# Patient Record
Sex: Male | Born: 1965 | Race: Black or African American | Hispanic: No | Marital: Married | Smoking: Current every day smoker
Health system: Southern US, Community
[De-identification: ages and names within clinical notes are randomized; demographics above are authoritative.]

## PROBLEM LIST (undated history)

## (undated) HISTORY — PX: HERNIA REPAIR: SHX51

---

## 2022-02-01 ENCOUNTER — Other Ambulatory Visit: Payer: Self-pay

## 2022-02-01 ENCOUNTER — Emergency Department: Payer: Non-veteran care

## 2022-02-01 ENCOUNTER — Emergency Department
Admission: EM | Admit: 2022-02-01 | Discharge: 2022-02-01 | Disposition: A | Discharge status: Other Acute Inpatient Hospital | Payer: Non-veteran care | Attending: Emergency Medicine | Admitting: Emergency Medicine

## 2022-02-01 DIAGNOSIS — A419 Sepsis, unspecified organism: Secondary | ICD-10-CM | POA: Diagnosis not present

## 2022-02-01 DIAGNOSIS — Z20822 Contact with and (suspected) exposure to covid-19: Secondary | ICD-10-CM | POA: Insufficient documentation

## 2022-02-01 DIAGNOSIS — R0602 Shortness of breath: Secondary | ICD-10-CM | POA: Diagnosis present

## 2022-02-01 LAB — COMPREHENSIVE METABOLIC PANEL
ALT: 53 U/L — ABNORMAL HIGH (ref 0–44)
AST: 44 U/L — ABNORMAL HIGH (ref 15–41)
Albumin: 2.8 g/dL — ABNORMAL LOW (ref 3.5–5.0)
Alkaline Phosphatase: 108 U/L (ref 38–126)
Anion gap: 16 — ABNORMAL HIGH (ref 5–15)
BUN: 22 mg/dL — ABNORMAL HIGH (ref 6–20)
CO2: 25 mmol/L (ref 22–32)
Calcium: 8.5 mg/dL — ABNORMAL LOW (ref 8.9–10.3)
Chloride: 92 mmol/L — ABNORMAL LOW (ref 98–111)
Creatinine, Ser: 1.21 mg/dL (ref 0.61–1.24)
GFR, Estimated: >60 mL/min (ref >60)
Glucose, Bld: 282 mg/dL — ABNORMAL HIGH (ref 70–99)
Potassium: 4.5 mmol/L (ref 3.5–5.1)
Sodium: 133 mmol/L — ABNORMAL LOW (ref 135–145)
Total Bilirubin: 2.2 mg/dL — ABNORMAL HIGH (ref 0.3–1.2)
Total Protein: 7.2 g/dL (ref 6.5–8.1)

## 2022-02-01 LAB — CBC WITH DIFFERENTIAL/PLATELET
Abs Immature Granulocytes: 0.1 10*3/uL — ABNORMAL HIGH (ref 0.00–0.07)
Basophils Absolute: 0.1 10*3/uL (ref 0.0–0.1)
Basophils Relative: 1 %
Eosinophils Absolute: 0 10*3/uL (ref 0.0–0.5)
Eosinophils Relative: 0 %
HCT: 52.9 % — ABNORMAL HIGH (ref 39.0–52.0)
Hemoglobin: 17.6 g/dL — ABNORMAL HIGH (ref 13.0–17.0)
Immature Granulocytes: 1 %
Lymphocytes Relative: 6 %
Lymphs Abs: 1 10*3/uL (ref 0.7–4.0)
MCH: 30.5 pg (ref 26.0–34.0)
MCHC: 33.3 g/dL (ref 30.0–36.0)
MCV: 91.7 fL (ref 80.0–100.0)
Monocytes Absolute: 2.6 10*3/uL — ABNORMAL HIGH (ref 0.1–1.0)
Monocytes Relative: 16 %
Neutro Abs: 12.7 10*3/uL — ABNORMAL HIGH (ref 1.7–7.7)
Neutrophils Relative %: 76 %
Platelets: 382 10*3/uL (ref 150–400)
RBC: 5.77 MIL/uL (ref 4.22–5.81)
RDW: 13.2 % (ref 11.5–15.5)
Smear Review: NORMAL
WBC: 16.4 10*3/uL — ABNORMAL HIGH (ref 4.0–10.5)
nRBC: 0 % (ref 0.0–0.2)

## 2022-02-01 LAB — LACTIC ACID, PLASMA: Lactic Acid, Venous: 3.4 mmol/L (ref 0.5–1.9)

## 2022-02-01 LAB — BLOOD GAS, VENOUS
Acid-Base Excess: 1.5 mmol/L (ref 0.0–2.0)
Bicarbonate: 27.2 mmol/L (ref 20.0–28.0)
O2 Saturation: 75.9 %
Patient temperature: 37
pCO2, Ven: 46 mmHg (ref 44–60)
pH, Ven: 7.38 (ref 7.25–7.43)
pO2, Ven: 42 mmHg (ref 32–45)

## 2022-02-01 LAB — RESP PANEL BY RT-PCR (FLU A&B, COVID) ARPGX2
Influenza A by PCR: NEGATIVE
Influenza B by PCR: NEGATIVE
SARS Coronavirus 2 by RT PCR: NEGATIVE

## 2022-02-01 LAB — PROTIME-INR
INR: 1.2 (ref 0.8–1.2)
Prothrombin Time: 15.4 seconds — ABNORMAL HIGH (ref 11.4–15.2)

## 2022-02-01 LAB — TROPONIN I (HIGH SENSITIVITY)
Troponin I (High Sensitivity): 72 ng/L — ABNORMAL HIGH (ref <18)
Troponin I (High Sensitivity): 53 ng/L — ABNORMAL HIGH (ref <18)

## 2022-02-01 LAB — APTT: aPTT: 31 seconds (ref 24–36)

## 2022-02-01 LAB — BRAIN NATRIURETIC PEPTIDE: B Natriuretic Peptide: 888.4 pg/mL — ABNORMAL HIGH (ref 0.0–100.0)

## 2022-02-01 MED ORDER — IOHEXOL 300 MG/ML  SOLN
75.0000 mL | Freq: Once | INTRAMUSCULAR | Status: AC | PRN
  Administered 2022-02-01: 75 mL via INTRAVENOUS

## 2022-02-01 MED ORDER — DILTIAZEM HCL-DEXTROSE 125-5 MG/125ML-% IV SOLN (PREMIX)
5.0000 mg/h | INTRAVENOUS | Status: DC
  Administered 2022-02-01: 5 mg/h via INTRAVENOUS
  Filled 2022-02-01: qty 125

## 2022-02-01 MED ORDER — LACTATED RINGERS IV BOLUS (SEPSIS)
1000.0000 mL | Freq: Once | INTRAVENOUS | Status: AC
  Administered 2022-02-01: 1000 mL via INTRAVENOUS

## 2022-02-01 MED ORDER — LACTATED RINGERS IV BOLUS
1000.0000 mL | Freq: Once | INTRAVENOUS | Status: AC
  Administered 2022-02-01: 1000 mL via INTRAVENOUS

## 2022-02-01 MED ORDER — FENTANYL CITRATE PF 50 MCG/ML IJ SOSY
50.0000 ug | PREFILLED_SYRINGE | INTRAMUSCULAR | Status: DC | PRN
  Administered 2022-02-01: 50 ug via INTRAVENOUS
  Filled 2022-02-01: qty 1

## 2022-02-01 MED ORDER — SODIUM CHLORIDE 0.9 % IV SOLN
500.0000 mg | Freq: Once | INTRAVENOUS | Status: AC
  Administered 2022-02-01: 500 mg via INTRAVENOUS
  Filled 2022-02-01: qty 5

## 2022-02-01 MED ORDER — SODIUM CHLORIDE 0.9 % IV SOLN: Freq: Once | INTRAVENOUS | Status: AC

## 2022-02-01 MED ORDER — SODIUM CHLORIDE 0.9 % IV SOLN
2.0000 g | Freq: Once | INTRAVENOUS | Status: AC
  Administered 2022-02-01: 2 g via INTRAVENOUS
  Filled 2022-02-01: qty 20

## 2022-02-01 MED ORDER — ESMOLOL HCL-SODIUM CHLORIDE 2000 MG/100ML IV SOLN
25.0000 ug/kg/min | INTRAVENOUS | Status: DC
  Filled 2022-02-01: qty 100

## 2022-02-01 MED ORDER — VANCOMYCIN HCL IN DEXTROSE 1-5 GM/200ML-% IV SOLN
1000.0000 mg | Freq: Once | INTRAVENOUS | Status: AC
  Administered 2022-02-01: 1000 mg via INTRAVENOUS
  Filled 2022-02-01: qty 200

## 2022-02-01 NOTE — Consult Note (Signed)
PHARMACY -  BRIEF ANTIBIOTIC NOTE  ? ?Pharmacy has received consult(s) for vancomycin from an ED provider.  The patient's profile has been reviewed for ht/wt/allergies/indication/available labs.   ? ?One time order(s) placed for vancomycin 1 g  ? ?Further antibiotics/pharmacy consults should be ordered by admitting physician if indicated.       ?                ?Thank you, ?Derrek Gu, PharmD ?02/01/2022  7:36 AM ? ?

## 2022-02-01 NOTE — ED Notes (Signed)
Called [facility] for transfer spoke to [place] and images powershared [phone] ?

## 2022-02-01 NOTE — ED Notes (Signed)
EMTALA reviewed by this Charge RN and all required documentation is up to date. Pt is ready for transport at this time. ?

## 2022-02-01 NOTE — ED Provider Notes (Addendum)
Patient received in signout from Dr. Jacqualine Code.  Patient presenting with symptoms consistent with sepsis concerning for pneumonia with respiratory distress.  Currently protecting his airway.  Tachycardic to the 140s seems consistent with 2-1 and a flutter.  Chest x-ray on my review and interpretation does not show any evidence of pneumothorax but has consolidation of the left lung fields with suspected large effusion.  Will start empiric broad-spectrum antibiotics as he also has a leukocytosis.  Awaiting remainder of blood work and septic work-up. ? ?Patient's lactate is elevated work-up consistent with sepsis.  CT imaging ordered for complicated pneumonia shows evidence of large empyema with consolidation and likely bronchial obstruction.  He is persistently tachycardic but normotensive.  He is received his 30 cc/kg bolus and is still tachycardic and what appears to be persistent a flutter will place on esmolol drip for close titration.  Discussed the case with intensivist at this facility none they suspect patient is going to need cardiothoracic surgery as we do not have at this facility.  Patient receives his care at the [place] in [place] is requesting to be transferred to [facility].  Will consult [facility] for transfer. ? ?----------------------------------------- ?9:42 AM on 02/01/2022 ?----------------------------------------- ?Discussed case in consultation with Dr. Elenor Quinones of CT surgery at [facility] who is currently accepted patient to their service for operative management will keep n.p.o.  Patient remains tachycardic but otherwise well perfused and hemodynamically stable for transfer. ?  ?Merlyn Lot, MD ?02/01/22 [phone] ? ?----------------------------------------- ?10:06 AM on 02/01/2022 ?----------------------------------------- ? ?[facility] reached back out requesting patient be transition to diltiazem if possible as esmolol requires ICU placement in their limited capacity.  Will trial diltiazem.  He does appear  appropriately fluid resuscitated.  Troponin downtrending. ? ?.Critical Care ?Performed by: Merlyn Lot, MD ?Authorized by: Merlyn Lot, MD  ? ?Critical care provider statement:  ?  Critical care time (minutes):  34 ?  Critical care was necessary to treat or prevent imminent or life-threatening deterioration of the following conditions:  Sepsis ?  Critical care was time spent personally by me on the following activities:  Ordering and performing treatments and interventions, ordering and review of laboratory studies, ordering and review of radiographic studies, pulse oximetry, re-evaluation of patient's condition, review of old charts, obtaining history from patient or surrogate, examination of patient, evaluation of patient's response to treatment, discussions with primary provider, discussions with consultants and development of treatment plan with patient or surrogate ? ?  ?Merlyn Lot, MD ?02/01/22 1007 ? ?

## 2022-02-01 NOTE — ED Provider Notes (Signed)
? ?[facility] ?Provider Note ? ? ? Event Date/Time  ? First MD Initiated Contact with Patient 02/01/22 [phone]   ?  (approximate) ? ? ?History  ? ?Shortness of Breath ? ? ?HPI ? ?Shawn FarrierKenneth L Steele is a 56 y.o. male   reports a history of ammonia,.  Patient reports no significant past medical history except he does take a blood pressure medicine carvedilol.  Review of note from January 30, 2022 patient seen by provider Dr. Tanna Furryolley at [facility].  Patient was diagnosed with pneumonia.  Patient reports she was given prescription for Tessalon Perle, but he got no antibiotic from the pharmacy when he went to pick it up ? ?Reports good health until about Saturday when he started develop a cough and congestion.  It is worsened.  Now associated with shortness of breath ? ?EMS reports patient appeared diaphoretic dyspneic, started on Solu-Medrol and had wheezing given 1 DuoNeb. ? ?EMS also reports patient appeared to be in a-flutter ? ? ? ?  ? ? ?Physical Exam  ? ?Triage Vital Signs: ?ED Triage Vitals  ?Enc Vitals Group  ?   BP 02/01/22 0653 133/85  ?   Pulse Rate 02/01/22 0653 (!) 146  ?   Resp 02/01/22 0653 (!) 24  ?   Temp 02/01/22 0653 98.4 ?F (36.9 ?C)  ?   Temp Source 02/01/22 0653 Oral  ?   SpO2 02/01/22 0653 91 %  ?   Weight 02/01/22 0654 135 lb (61.2 kg)  ?   Height 02/01/22 0654 5\' 4"  (1.626 m)  ?   Head Circumference --   ?   Peak Flow --   ?   Pain Score 02/01/22 0654 0  ?   Pain Loc --   ?   Pain Edu? --   ?   Excl. in GC? --   ? ? ?Most recent vital signs: ?Vitals:  ? 02/01/22 0715 02/01/22 0730  ?BP:  122/82  ?Pulse: (!) 147 (!) 148  ?Resp: (!) 21 18  ?Temp:    ?SpO2: 96% 97%  ? ? ? ?General: Awake, but appears in distress.  Increased work of breathing with mild tachypnea but notably tachycardic.  Slightly diaphoretic.  Work of breathing mildly elevated but in no extremis. ?CV:  Good peripheral perfusion.  Tachycardic, no noted murmurs ?Resp:  Mild tachypnea, increased work of breathing.   Accessory muscle use.  Lung sounds very diminished over the left chest, normal respirations and clear lung tones over the right.  Notable rales rhonchi and diminished lung sounds over the left chest. ?Abd:  No distention.  Denies abdominal pain to palpation of all quadrants. ?Lower extremities negative for edema.  No JVD. ?No rash.  Normal capillary refill ?Other:   ? ? ?ED Results / Procedures / Treatments  ? ?Labs ?(all labs ordered are listed, but only abnormal results are displayed) ?Labs Reviewed  ?COMPREHENSIVE METABOLIC PANEL - Abnormal; Notable for the following components:  ?    Result Value  ? Sodium 133 (*)   ? Chloride 92 (*)   ? Glucose, Bld 282 (*)   ? BUN 22 (*)   ? Calcium 8.5 (*)   ? Albumin 2.8 (*)   ? AST 44 (*)   ? ALT 53 (*)   ? Total Bilirubin 2.2 (*)   ? Anion gap 16 (*)   ? All other components within normal limits  ?CBC WITH DIFFERENTIAL/PLATELET - Abnormal; Notable for the following components:  ?  WBC 16.4 (*)   ? Hemoglobin 17.6 (*)   ? HCT 52.9 (*)   ? Neutro Abs 12.7 (*)   ? Monocytes Absolute 2.6 (*)   ? Abs Immature Granulocytes 0.10 (*)   ? All other components within normal limits  ?PROTIME-INR - Abnormal; Notable for the following components:  ? Prothrombin Time 15.4 (*)   ? All other components within normal limits  ?TROPONIN I (HIGH SENSITIVITY) - Abnormal; Notable for the following components:  ? Troponin I (High Sensitivity) 72 (*)   ? All other components within normal limits  ?CULTURE, BLOOD (ROUTINE X 2)  ?CULTURE, BLOOD (ROUTINE X 2)  ?URINE CULTURE  ?MRSA NEXT GEN BY PCR, NASAL  ?APTT  ?BLOOD GAS, VENOUS  ?LACTIC ACID, PLASMA  ?LACTIC ACID, PLASMA  ?URINALYSIS, COMPLETE (UACMP) WITH MICROSCOPIC  ?BRAIN NATRIURETIC PEPTIDE  ? ? ? ?EKG ? ?EKG is reviewed and interpreted by me, appears to be atrial flutter with 2 1 conduction, heart rate 150.  P waves noted to be in flutter.  QRS normal.  Nonspecific T wave abnormality, suspect rate related.  No obvious acute ischemic changes  noted ? ? ?RADIOLOGY ? ?Chest x-ray pending at time of signout, Dr. Roxan Hockey to review ? ? ?PROCEDURES: ? ?Critical Care performed: Yes, see critical care procedure note(s) ? ?Patient presents with extreme tachycardia in the setting of recent pneumonia, but reports she was not able to pick up antibiotic portion of his prescription.  His heart rate is significantly elevated, and patient is felt to be at elevated risk for acute cardiovascular collapse requiring prompt an emergent evaluation by myself. ? ?CRITICAL CARE ?Performed by: Sharyn Creamer ? ? ?Total critical care time: 25 minutes ? ?Critical care time was exclusive of separately billable procedures and treating other patients. ? ?Critical care was necessary to treat or prevent imminent or life-threatening deterioration. ? ?Critical care was time spent personally by me on the following activities: development of treatment plan with patient and/or surrogate as well as nursing, discussions with consultants, evaluation of patient's response to treatment, examination of patient, obtaining history from patient or surrogate, ordering and performing treatments and interventions, ordering and review of laboratory studies, ordering and review of radiographic studies, pulse oximetry and re-evaluation of patient's condition. ? ? ?Procedures ? ? ?MEDICATIONS ORDERED IN ED: ?Medications  ?lactated ringers bolus 1,000 mL (1,000 mLs Intravenous New Bag/Given 02/01/22 0734)  ?vancomycin (VANCOCIN) IVPB 1000 mg/200 mL premix (has no administration in time range)  ?azithromycin (ZITHROMAX) 500 mg in sodium chloride 0.9 % 250 mL IVPB (has no administration in time range)  ?lactated ringers bolus 1,000 mL (1,000 mLs Intravenous New Bag/Given 02/01/22 0717)  ?cefTRIAXone (ROCEPHIN) 2 g in sodium chloride 0.9 % 100 mL IVPB (0 g Intravenous Stopped 02/01/22 0807)  ? ? ? ?IMPRESSION / MDM / ASSESSMENT AND PLAN / ED COURSE  ?I reviewed the triage vital signs and the nursing notes. ?              ?               ? ?Differential diagnosis includes, but is not limited to, acute pneumonia or sepsis, viral etiologies, influenza, and based upon his chest exam also notable concern for possible pleural effusion, empyema, and less likely ACS or acute pulmonary embolism.  He seems to be presenting with signs and symptoms highly suggestive of infectious etiology with acute pulmonary symptomatology as well as atrial flutter with 2 1 conduction and tachycardia.  Will  initiate fluid bolus, await laboratory testing and x-ray.. ? ?The patient is on the cardiac monitor to evalate for evidence of arrhythmia and/or significant heart rate changes. ? ?Clinical Course as of 02/01/22 0811  ?Fri Feb 01, 2022  ?0709 Ongoing care assigned to Dr. Roxan Hockey.  Patient has a significant battery of remaining testing including sepsis panel, chest x-ray, reassessments etc.  At this point given the patient's apparent atrial flutter with 2-1 block we will trial fluid bolus to see if this results in improvement in rate, but may need consideration for additional or use of rate control medication such as diltiazem if not responding. [MQ]  ?0630 Discussed with Dr. Roxan Hockey that the patient [MQ]  ?1601 Discussed with Dr. Roxan Hockey I certainly anticipate this patient will require admission to the hospital, but much of his work-up is pending at this time. [MQ]  ?  ?Clinical Course User Index ?[MQ] Sharyn Creamer, MD  ? ? ? ?FINAL CLINICAL IMPRESSION(S) / ED DIAGNOSES  ? ?Final diagnoses:  ?Sepsis, due to unspecified organism, unspecified whether acute organ dysfunction present [facility])  ? ?My above listed impression is very provisional, Dr. Roxan Hockey is following up on the entirety of the patient's laboratory and imaging testing as well as reevaluations and responses to treatment. ? ?Rx / DC Orders  ? ?ED Discharge Orders   ? ? None  ? ?  ? ? ? ?Note:  This document was prepared using Dragon voice recognition software and may include unintentional  dictation errors. ?  Sharyn Creamer, MD ?02/01/22 [phone] ? ?

## 2022-02-01 NOTE — ED Notes (Signed)
Report given to [facility], ED RN at [facility]  ?

## 2022-02-01 NOTE — ED Notes (Signed)
Report given and care endorsed to oncoming shift 

## 2022-02-01 NOTE — ED Notes (Signed)
Report given to Sharen Heck with [employer].  ?

## 2022-02-01 NOTE — ED Notes (Signed)
Received call from [facility] patient accepted ED to ED  report number [id number]   ?

## 2022-02-01 NOTE — ED Triage Notes (Signed)
Pt to room 12 [facility] with c/o shortness of breath. Dx with PNA 2 days ago and did not begin taking antibiotics as prescribed.  ?

## 2022-02-01 NOTE — ED Notes (Signed)
Pt to ED c/o SOB and CP. Pt seen 2 days ago and diagnosed with pneumonia. Pt has not started antibiotics. Pt states SOB has worsened. Pt HR between 140-180s. Pt on 2L [place] d/t RA sat 88%. Pt diaphoretic. Pt A&Ox4.  ?

## 2022-02-01 NOTE — ED Notes (Signed)
Pt requesting and given blanket; no other needs voiced at this time. ?

## 2022-02-01 NOTE — ED Notes (Signed)
Pt requesting monitoring alarms be turned off. Pt monitor placed on comfort care in room. MD aware.  ?

## 2022-02-06 LAB — CULTURE, BLOOD (ROUTINE X 2)
Culture: NO GROWTH
Culture: NO GROWTH
Special Requests: ADEQUATE

## 2022-03-29 ENCOUNTER — Other Ambulatory Visit: Payer: Self-pay

## 2022-03-29 ENCOUNTER — Encounter (HOSPITAL_COMMUNITY): Payer: Self-pay

## 2022-03-29 ENCOUNTER — Emergency Department (HOSPITAL_COMMUNITY): Payer: No Typology Code available for payment source

## 2022-03-29 ENCOUNTER — Emergency Department (HOSPITAL_COMMUNITY)
Admission: EM | Admit: 2022-03-29 | Discharge: 2022-03-29 | Disposition: A | Discharge status: Home / Self Care | Payer: No Typology Code available for payment source | Attending: Emergency Medicine | Admitting: Emergency Medicine

## 2022-03-29 DIAGNOSIS — K2920 Alcoholic gastritis without bleeding: Secondary | ICD-10-CM

## 2022-03-29 DIAGNOSIS — R1013 Epigastric pain: Secondary | ICD-10-CM | POA: Diagnosis present

## 2022-03-29 DIAGNOSIS — R001 Bradycardia, unspecified: Secondary | ICD-10-CM | POA: Diagnosis not present

## 2022-03-29 LAB — COMPREHENSIVE METABOLIC PANEL
ALT: 17 U/L (ref 0–44)
AST: 23 U/L (ref 15–41)
Albumin: 3.8 g/dL (ref 3.5–5.0)
Alkaline Phosphatase: 94 U/L (ref 38–126)
Anion gap: 14 (ref 5–15)
BUN: 8 mg/dL (ref 6–20)
CO2: 25 mmol/L (ref 22–32)
Calcium: 9.4 mg/dL (ref 8.9–10.3)
Chloride: 101 mmol/L (ref 98–111)
Creatinine, Ser: 0.82 mg/dL (ref 0.61–1.24)
GFR, Estimated: >60 mL/min (ref >60)
Glucose, Bld: 135 mg/dL — ABNORMAL HIGH (ref 70–99)
Potassium: 4 mmol/L (ref 3.5–5.1)
Sodium: 140 mmol/L (ref 135–145)
Total Bilirubin: 1.3 mg/dL — ABNORMAL HIGH (ref 0.3–1.2)
Total Protein: 8.3 g/dL — ABNORMAL HIGH (ref 6.5–8.1)

## 2022-03-29 LAB — CBC WITH DIFFERENTIAL/PLATELET
Abs Immature Granulocytes: 0.03 10*3/uL (ref 0.00–0.07)
Basophils Absolute: 0.1 10*3/uL (ref 0.0–0.1)
Basophils Relative: 1 %
Eosinophils Absolute: 0 10*3/uL (ref 0.0–0.5)
Eosinophils Relative: 0 %
HCT: 52.1 % — ABNORMAL HIGH (ref 39.0–52.0)
Hemoglobin: 17.3 g/dL — ABNORMAL HIGH (ref 13.0–17.0)
Immature Granulocytes: 0 %
Lymphocytes Relative: 17 %
Lymphs Abs: 1.7 10*3/uL (ref 0.7–4.0)
MCH: 30.5 pg (ref 26.0–34.0)
MCHC: 33.2 g/dL (ref 30.0–36.0)
MCV: 91.7 fL (ref 80.0–100.0)
Monocytes Absolute: 0.2 10*3/uL (ref 0.1–1.0)
Monocytes Relative: 2 %
Neutro Abs: 8 10*3/uL — ABNORMAL HIGH (ref 1.7–7.7)
Neutrophils Relative %: 80 %
Platelets: 542 10*3/uL — ABNORMAL HIGH (ref 150–400)
RBC: 5.68 MIL/uL (ref 4.22–5.81)
RDW: 16.8 % — ABNORMAL HIGH (ref 11.5–15.5)
WBC: 10 10*3/uL (ref 4.0–10.5)
nRBC: 0 % (ref 0.0–0.2)

## 2022-03-29 LAB — URINALYSIS, ROUTINE W REFLEX MICROSCOPIC
Bacteria, UA: NONE SEEN
Bilirubin Urine: NEGATIVE
Glucose, UA: 50 mg/dL — AB
Ketones, ur: 80 mg/dL — AB
Leukocytes,Ua: NEGATIVE
Nitrite: NEGATIVE
Protein, ur: NEGATIVE mg/dL
Specific Gravity, Urine: 1.01 (ref 1.005–1.030)
pH: 6 (ref 5.0–8.0)

## 2022-03-29 LAB — LIPASE, BLOOD: Lipase: 26 U/L (ref 11–51)

## 2022-03-29 MED ORDER — MORPHINE SULFATE (PF) 4 MG/ML IV SOLN
4.0000 mg | Freq: Once | INTRAVENOUS | Status: AC
  Administered 2022-03-29: 4 mg via INTRAVENOUS
  Filled 2022-03-29: qty 1

## 2022-03-29 MED ORDER — ONDANSETRON HCL 4 MG/2ML IJ SOLN
4.0000 mg | Freq: Once | INTRAMUSCULAR | Status: AC
  Administered 2022-03-29: 4 mg via INTRAVENOUS
  Filled 2022-03-29: qty 2

## 2022-03-29 MED ORDER — IOHEXOL 300 MG/ML  SOLN
100.0000 mL | Freq: Once | INTRAMUSCULAR | Status: AC | PRN
  Administered 2022-03-29: 100 mL via INTRAVENOUS

## 2022-03-29 MED ORDER — PANTOPRAZOLE SODIUM 40 MG IV SOLR
40.0000 mg | Freq: Once | INTRAVENOUS | Status: AC
  Administered 2022-03-29: 40 mg via INTRAVENOUS
  Filled 2022-03-29: qty 10

## 2022-03-29 MED ORDER — ONDANSETRON HCL 4 MG PO TABS: 4.0000 mg | ORAL_TABLET | Freq: Four times a day (QID) | ORAL | 0 refills | Status: AC

## 2022-03-29 MED ORDER — LACTATED RINGERS IV BOLUS
1000.0000 mL | Freq: Once | INTRAVENOUS | Status: AC
  Administered 2022-03-29: 1000 mL via INTRAVENOUS

## 2022-03-29 NOTE — ED Notes (Signed)
Pt states understanding of dc instructions, importance of follow up. Pt denies questions or concerns and wheelchair transportation assistance provided upon dc. No belongings left in room upon dc. ? ?

## 2022-03-29 NOTE — ED Triage Notes (Signed)
[facility] reports pt coming from home c/o abdominal pain with nausea and vomiting this morning. ?

## 2022-03-29 NOTE — ED Provider Notes (Signed)
?[facility]-EMERGENCY DEPT ?Provider Note ? ? ?CSN: [id number] ?Arrival date & time: 03/29/22  1452 ? ?  ? ?History ? ?Chief Complaint  ?Patient presents with  ? Abdominal Pain  ? ? ?Lenoria FarrierKenneth L Chihuahua is a 56 y.o. male. ? ?Pt 55y/o male with hx of recent PNA with admission [facility] with left thoracoscopic total pulmonary decortication with intrapleural pneumonolysis, bronchoscopy for persistent empyema who is presenting today with complaints of abdominal pain.  Patient reports he has had abdominal pain since waking up this morning.  It is mostly in his epigastric area and around his umbilicus.  The pain is burning and constant.  It is worse when he tries to eat or drink anything and he has had at least 3 episodes of emesis.  He has also had some mild diarrhea.  He denies any blood in his emesis or stool.  He denies feeling short of breath he has felt clammy today but is unsure if he has had a fever.  He has had no prior abdominal surgeries.  Patient does admit to drinking too much last night.  He drank liquor, beer and wine.  His symptoms started when he woke up this morning. ? ?The history is provided by the patient.  ?Abdominal Pain ? ?  ? ?Home Medications ?Prior to Admission medications   ?Medication Sig Start Date End Date Taking? Authorizing Provider  ?ondansetron (ZOFRAN) 4 MG tablet Take 1 tablet (4 mg total) by mouth every 6 (six) hours. 03/29/22  Yes Gwyneth SproutPlunkett, Amanie Mcculley, MD  ?   ? ?Allergies    ?Patient has no known allergies.   ? ?Review of Systems   ?Review of Systems  ?Gastrointestinal:  Positive for abdominal pain.  ? ?Physical Exam ?Updated Vital Signs ?BP (!) 186/107   Pulse (!) 55   Temp 97.9 ?F (36.6 ?C) (Oral)   Resp 16   SpO2 100%  ?Physical Exam ?Vitals and nursing note reviewed.  ?Constitutional:   ?   General: He is not in acute distress. ?   Appearance: He is well-developed.  ?HENT:  ?   Head: Normocephalic and atraumatic.  ?   Mouth/Throat:  ?   Mouth: Mucous membranes are  dry.  ?Eyes:  ?   Conjunctiva/sclera: Conjunctivae normal.  ?   Pupils: Pupils are equal, round, and reactive to light.  ?Cardiovascular:  ?   Rate and Rhythm: Regular rhythm. Bradycardia present.  ?   Heart sounds: No murmur heard. ?Pulmonary:  ?   Effort: Pulmonary effort is normal. No respiratory distress.  ?   Breath sounds: Normal breath sounds. No wheezing or rales.  ?Abdominal:  ?   General: Bowel sounds are increased. There is no distension.  ?   Palpations: Abdomen is soft.  ?   Tenderness: There is abdominal tenderness in the epigastric area and periumbilical area. There is guarding. There is no rebound.  ?Musculoskeletal:     ?   General: No tenderness. Normal range of motion.  ?   Cervical back: Normal range of motion and neck supple.  ?   Right lower leg: No edema.  ?   Left lower leg: No edema.  ?Skin: ?   General: Skin is warm and dry.  ?   Findings: No erythema or rash.  ?Neurological:  ?   Mental Status: He is alert and oriented to person, place, and time. Mental status is at baseline.  ?Psychiatric:     ?   Mood and Affect: Mood  normal.     ?   Behavior: Behavior normal.  ? ? ?ED Results / Procedures / Treatments   ?Labs ?(all labs ordered are listed, but only abnormal results are displayed) ?Labs Reviewed  ?CBC WITH DIFFERENTIAL/PLATELET - Abnormal; Notable for the following components:  ?    Result Value  ? Hemoglobin 17.3 (*)   ? HCT 52.1 (*)   ? RDW 16.8 (*)   ? Platelets 542 (*)   ? Neutro Abs 8.0 (*)   ? All other components within normal limits  ?URINALYSIS, ROUTINE W REFLEX MICROSCOPIC - Abnormal; Notable for the following components:  ? Color, Urine STRAW (*)   ? Glucose, UA 50 (*)   ? Hgb urine dipstick MODERATE (*)   ? Ketones, ur 80 (*)   ? All other components within normal limits  ?COMPREHENSIVE METABOLIC PANEL - Abnormal; Notable for the following components:  ? Glucose, Bld 135 (*)   ? Total Protein 8.3 (*)   ? Total Bilirubin 1.3 (*)   ? All other components within normal limits   ?LIPASE, BLOOD  ? ? ?EKG ?EKG Interpretation ? ?Date/Time:  Friday Mar 29 2022 15:13:55 EDT ?Ventricular Rate:  59 ?PR Interval:  189 ?QRS Duration: 91 ?QT Interval:  493 ?QTC Calculation: 489 ?R Axis:   82 ?Text Interpretation: Sinus rhythm Probable left atrial enlargement Anterior infarct, age indeterminate Atrial tachycardia RESOLVED SINCE PREVIOUS Confirmed by Gwyneth Sprout ([id number]) on 03/29/2022 6:39:34 PM ? ?Radiology ?DG Abdomen Acute W/Chest ? ?Result Date: 03/29/2022 ?CLINICAL DATA:  Abdominal pain, nausea and vomiting EXAM: DG ABDOMEN ACUTE WITH 1 VIEW CHEST COMPARISON:  02/01/2022 FINDINGS: Supine and upright frontal views of the abdomen and pelvis as well as an upright frontal view of the chest are obtained. Cardiac silhouette is unremarkable. Since the prior exam, there has been resolution of the multilocular left pleural effusion. Minimal residual fluid or pleural thickening is seen at the left lateral costophrenic angle. No airspace disease or pneumothorax. Bowel gas pattern is unremarkable without obstruction or ileus. There are no masses or abnormal calcifications. No free gas in the greater peritoneal sac. IMPRESSION: 1. Unremarkable bowel gas pattern. 2. Trace residual left pleural fluid versus pleural thickening. Complete resolution of the multilocular empyema seen previously. Electronically Signed   By: Sharlet Salina M.D.   On: 03/29/2022 18:56   ? ?Procedures ?Procedures  ? ? ?Medications Ordered in ED ?Medications  ?ondansetron (ZOFRAN) injection 4 mg (4 mg Intravenous Given 03/29/22 1855)  ?morphine (PF) 4 MG/ML injection 4 mg (4 mg Intravenous Given 03/29/22 1855)  ?lactated ringers bolus 1,000 mL (1,000 mLs Intravenous New Bag/Given 03/29/22 1856)  ?pantoprazole (PROTONIX) injection 40 mg (40 mg Intravenous Given 03/29/22 1856)  ?morphine (PF) 4 MG/ML injection 4 mg (4 mg Intravenous Given 03/29/22 2110)  ? ? ?ED Course/ Medical Decision Making/ A&P ?  ?                        ?Medical  Decision Making ?Amount and/or Complexity of Data Reviewed ?External Data Reviewed: notes. ?   Details: Recent hospitalization ?Labs: ordered. Decision-making details documented in ED Course. ?Radiology: ordered and independent interpretation performed. Decision-making details documented in ED Course. ?ECG/medicine tests: ordered and independent interpretation performed. Decision-making details documented in ED Course. ? ?Risk ?Prescription drug management. ? ? ?Pt with multiple medical problems and comorbidities and presenting today with a complaint that caries a high risk for morbidity and mortality.  Presenting today with  abdominal pain.  Patient most likely has alcoholic pancreatitis or gastritis as he reports drinking a lot of alcohol yesterday and waking up this morning with the pain.  Patient does have a significant history of recent empyema and persistent pneumonia that did undergo surgery and had drains placed but all of that has been removed and he reports from that standpoint he has been doing really well.  He denies any hematemesis or blood in his stool.  He does not have prior history of cirrhosis.  Patient appears to have abdominal pain on exam but is otherwise appears stable.  He is able to give a full history.  He will be given IV fluids, pain and nausea medication.  Labs are pending. ? ?10:17 PM ?I independently interpreted patient's EKG and labs today.  CMP within normal limits, CBC, lipase within normal limits, UA with ketones but no other significant findings.  I have independently visualized and interpreted pt's images today.  Imaging without evidence of bowel obstruction on abdominal films and radiology reports unremarkable bowel gas pattern and trace residual left pleural fluid versus pleural thickening and complete resolution of multiloculated empyema.  Findings were discussed with the patient.  On reevaluation he is feeling better.  He is tolerating p.o.'s and is requesting to go home.  At  this time do not feel that patient needs a CT of the abdomen pelvis.  He will take Prilosec for the next week avoid alcohol and NSAIDs.  He was given return precautions.  No social determinants affecting his dischar

## 2022-03-29 NOTE — ED Provider Notes (Signed)
Pt now with some SOB, difficult to assess lung sounds however no acute respiratory distress. Will get xray given hx ?  ?Nemiah Bubar A, PA-C ?03/29/22 1821 ? ?  ?Charlynne Pander, MD ?03/29/22 2253 ? ?

## 2022-03-29 NOTE — ED Provider Triage Note (Signed)
Emergency Medicine Provider Triage Evaluation Note ? ?Shawn Steele , a 56 y.o. male  was evaluated in triage.  Pt complains of periumbilical and epigastric abdominal pain.  Began earlier today.  NBNB emesis.  Some loose stool without melena or blood per rectum.  States he was recently diagnosed with pneumonia.  He denies any fever, cough, chest pain, shortness of breath.  No back pain.  No history of AAA, dissection.  Does drink alcohol multiple times a week.  No history of pancreatitis.  No chronic NSAID use. ? ?Review of Systems  ?Positive: Abd pain, diarrhea ?Negative: Fever, cough ? ?Physical Exam  ?BP (!) 145/117 (BP Location: Left Arm)   Pulse (!) 56   Temp 97.7 ?F (36.5 ?C) (Oral)   Resp 18   SpO2 98%  ?Gen:   Awake, no distress   ?Resp:  Normal effort  ?MSK:   Moves extremities without difficulty  ?Other:   ? ?Medical Decision Making  ?Medically screening exam initiated at 3:05 PM.  Appropriate orders placed.  Shawn Steele was informed that the remainder of the evaluation will be completed by another provider, this initial triage assessment does not replace that evaluation, and the importance of remaining in the ED until their evaluation is complete. ? ?Abd pain ?  ?Christinia Lambeth A, PA-C ?03/29/22 1506 ? ?

## 2022-03-29 NOTE — Discharge Instructions (Addendum)
Avoid alcohol and any type of ibuprofen or Aleve type products.  Make sure you are eating small frequent meals and take a Prilosec daily for the next 2 weeks.  If you start having fever, severe pain or worsening vomiting return to the emergency room. ?

## 2022-03-29 NOTE — ED Notes (Signed)
Pt. Made aware for the need of urine specimen. 

## 2022-12-17 ENCOUNTER — Emergency Department (HOSPITAL_COMMUNITY)
Admission: EM | Admit: 2022-12-17 | Discharge: 2022-12-17 | Disposition: A | Discharge status: Home / Self Care | Payer: No Typology Code available for payment source | Attending: Emergency Medicine | Admitting: Emergency Medicine

## 2022-12-17 ENCOUNTER — Emergency Department (HOSPITAL_COMMUNITY): Payer: No Typology Code available for payment source

## 2022-12-17 ENCOUNTER — Encounter (HOSPITAL_COMMUNITY): Payer: Self-pay

## 2022-12-17 DIAGNOSIS — R0602 Shortness of breath: Secondary | ICD-10-CM | POA: Diagnosis not present

## 2022-12-17 DIAGNOSIS — K2920 Alcoholic gastritis without bleeding: Secondary | ICD-10-CM

## 2022-12-17 DIAGNOSIS — Z1152 Encounter for screening for COVID-19: Secondary | ICD-10-CM | POA: Diagnosis not present

## 2022-12-17 DIAGNOSIS — R1013 Epigastric pain: Secondary | ICD-10-CM | POA: Diagnosis present

## 2022-12-17 LAB — CBC
HCT: 46 % (ref 39.0–52.0)
Hemoglobin: 15.7 g/dL (ref 13.0–17.0)
MCH: 30.5 pg (ref 26.0–34.0)
MCHC: 34.1 g/dL (ref 30.0–36.0)
MCV: 89.3 fL (ref 80.0–100.0)
Platelets: 305 10*3/uL (ref 150–400)
RBC: 5.15 MIL/uL (ref 4.22–5.81)
RDW: 13.1 % (ref 11.5–15.5)
WBC: 7.8 10*3/uL (ref 4.0–10.5)
nRBC: 0 % (ref 0.0–0.2)

## 2022-12-17 LAB — HEPATIC FUNCTION PANEL
ALT: 25 U/L (ref 0–44)
AST: 25 U/L (ref 15–41)
Albumin: 4.1 g/dL (ref 3.5–5.0)
Alkaline Phosphatase: 46 U/L (ref 38–126)
Bilirubin, Direct: 0.2 mg/dL (ref 0.0–0.2)
Indirect Bilirubin: 1.4 mg/dL — ABNORMAL HIGH (ref 0.3–0.9)
Total Bilirubin: 1.6 mg/dL — ABNORMAL HIGH (ref 0.3–1.2)
Total Protein: 7.7 g/dL (ref 6.5–8.1)

## 2022-12-17 LAB — TROPONIN I (HIGH SENSITIVITY)
Troponin I (High Sensitivity): 7 ng/L (ref <18)
Troponin I (High Sensitivity): 5 ng/L (ref <18)

## 2022-12-17 LAB — BASIC METABOLIC PANEL
Anion gap: 9 (ref 5–15)
BUN: 17 mg/dL (ref 6–20)
CO2: 25 mmol/L (ref 22–32)
Calcium: 9 mg/dL (ref 8.9–10.3)
Chloride: 96 mmol/L — ABNORMAL LOW (ref 98–111)
Creatinine, Ser: 0.84 mg/dL (ref 0.61–1.24)
GFR, Estimated: >60 mL/min (ref >60)
Glucose, Bld: 167 mg/dL — ABNORMAL HIGH (ref 70–99)
Potassium: 3.3 mmol/L — ABNORMAL LOW (ref 3.5–5.1)
Sodium: 130 mmol/L — ABNORMAL LOW (ref 135–145)

## 2022-12-17 LAB — RESP PANEL BY RT-PCR (RSV, FLU A&B, COVID)  RVPGX2
Influenza A by PCR: NEGATIVE
Influenza B by PCR: NEGATIVE
Resp Syncytial Virus by PCR: NEGATIVE
SARS Coronavirus 2 by RT PCR: NEGATIVE

## 2022-12-17 LAB — LIPASE, BLOOD: Lipase: 46 U/L (ref 11–51)

## 2022-12-17 MED ORDER — SUCRALFATE 1 G PO TABS: 1.0000 g | ORAL_TABLET | Freq: Four times a day (QID) | ORAL | 0 refills | Status: AC

## 2022-12-17 MED ORDER — ONDANSETRON HCL 4 MG/2ML IJ SOLN
4.0000 mg | Freq: Once | INTRAMUSCULAR | Status: AC
  Administered 2022-12-17: 4 mg via INTRAVENOUS
  Filled 2022-12-17: qty 2

## 2022-12-17 MED ORDER — FAMOTIDINE 20 MG PO TABS
40.0000 mg | ORAL_TABLET | Freq: Once | ORAL | Status: AC
  Administered 2022-12-17: 40 mg via ORAL
  Filled 2022-12-17: qty 2

## 2022-12-17 MED ORDER — LACTATED RINGERS IV SOLN
INTRAVENOUS | Status: DC
Start: 2022-12-17 — End: 2022-12-17

## 2022-12-17 MED ORDER — PANTOPRAZOLE SODIUM 40 MG IV SOLR
40.0000 mg | Freq: Once | INTRAVENOUS | Status: AC
  Administered 2022-12-17: 40 mg via INTRAVENOUS
  Filled 2022-12-17: qty 10

## 2022-12-17 MED ORDER — LACTATED RINGERS IV BOLUS
1000.0000 mL | Freq: Once | INTRAVENOUS | Status: AC
  Administered 2022-12-17: 1000 mL via INTRAVENOUS

## 2022-12-17 MED ORDER — HYDROMORPHONE HCL 1 MG/ML IJ SOLN
0.5000 mg | Freq: Once | INTRAMUSCULAR | Status: AC
  Administered 2022-12-17: 0.5 mg via INTRAVENOUS
  Filled 2022-12-17: qty 1

## 2022-12-17 NOTE — ED Triage Notes (Signed)
Per EMS pt was at [facility] with pneumonia, tonight shob got worse, couldn't breath tonight.  Pt was wheezing and rolling around on the floor at home.

## 2022-12-17 NOTE — ED Provider Notes (Addendum)
[place] EMERGENCY DEPARTMENT AT [facility] Provider Note   CSN: [id number] Arrival date & time: 12/17/22  3614     History  Chief Complaint  Patient presents with   Shortness of Breath    Shawn Steele is a 57 y.o. male.  57 year old male presents with epigastric abdominal pain.  Patient admits he drank copious amount of alcohol this weekend.  States that his pain is stomach is worse when he eats.  Does have some nausea vomiting.  No fever or chills.  No diarrhea.  States his pain has been feel short of breath.  Was concerned about possible pneumonia but yet has had no fever cough or congestion.  Called EMS and transported here       Home Medications Prior to Admission medications   Medication Sig Start Date End Date Taking? Authorizing Provider  ondansetron (ZOFRAN) 4 MG tablet Take 1 tablet (4 mg total) by mouth every 6 (six) hours. 03/29/22   Blanchie Dessert, MD      Allergies    Patient has no known allergies.    Review of Systems   Review of Systems  All other systems reviewed and are negative.   Physical Exam Updated Vital Signs BP (!) 154/111   Pulse 84   Temp 99.6 F (37.6 C) (Oral)   Resp 18   Ht 1.829 m (6')   Wt 95.3 kg   SpO2 100%   BMI 28.48 kg/m  Physical Exam Vitals and nursing note reviewed.  Constitutional:      General: He is not in acute distress.    Appearance: Normal appearance. He is well-developed. He is not toxic-appearing.  HENT:     Head: Normocephalic and atraumatic.  Eyes:     General: Lids are normal.     Conjunctiva/sclera: Conjunctivae normal.     Pupils: Pupils are equal, round, and reactive to light.  Neck:     Thyroid: No thyroid mass.     Trachea: No tracheal deviation.  Cardiovascular:     Rate and Rhythm: Normal rate and regular rhythm.     Heart sounds: Normal heart sounds. No murmur heard.    No gallop.  Pulmonary:     Effort: Pulmonary effort is normal. No respiratory distress.     Breath  sounds: Normal breath sounds. No stridor. No decreased breath sounds, wheezing, rhonchi or rales.  Abdominal:     General: There is no distension.     Palpations: Abdomen is soft.     Tenderness: There is abdominal tenderness in the epigastric area. There is guarding. There is no rebound.    Musculoskeletal:        General: No tenderness. Normal range of motion.     Cervical back: Normal range of motion and neck supple.  Skin:    General: Skin is warm and dry.     Findings: No abrasion or rash.  Neurological:     Mental Status: He is alert and oriented to person, place, and time. Mental status is at baseline.     GCS: GCS eye subscore is 4. GCS verbal subscore is 5. GCS motor subscore is 6.     Cranial Nerves: No cranial nerve deficit.     Sensory: No sensory deficit.     Motor: Motor function is intact.  Psychiatric:        Attention and Perception: Attention normal.        Speech: Speech normal.  Behavior: Behavior normal.     ED Results / Procedures / Treatments   Labs (all labs ordered are listed, but only abnormal results are displayed) Labs Reviewed  BASIC METABOLIC PANEL - Abnormal; Notable for the following components:      Result Value   Sodium 130 (*)    Potassium 3.3 (*)    Chloride 96 (*)    Glucose, Bld 167 (*)    All other components within normal limits  RESP PANEL BY RT-PCR (RSV, FLU A&B, COVID)  RVPGX2  CBC  LIPASE, BLOOD  HEPATIC FUNCTION PANEL  TROPONIN I (HIGH SENSITIVITY)  TROPONIN I (HIGH SENSITIVITY)    EKG EKG Interpretation  Date/Time:  Tuesday December 17 2022 03:33:38 EST Ventricular Rate:  74 PR Interval:  174 QRS Duration: 87 QT Interval:  401 QTC Calculation: 445 R Axis:   71 Text Interpretation: Sinus rhythm Atrial premature complexes Borderline T wave abnormalities Confirmed by Lacretia Leigh ([id number]) on 12/17/2022 7:05:28 AM  Radiology DG Chest 2 View  Result Date: 12/17/2022 CLINICAL DATA:  Shortness of breath EXAM:  CHEST - 2 VIEW COMPARISON:  03/29/2022 FINDINGS: The heart size and mediastinal contours are within normal limits. Both lungs are clear. The visualized skeletal structures are unremarkable. IMPRESSION: No active cardiopulmonary disease. Electronically Signed   By: Ulyses Jarred M.D.   On: 12/17/2022 04:00    Procedures Procedures    Medications Ordered in ED Medications  lactated ringers bolus 1,000 mL (has no administration in time range)  lactated ringers infusion (has no administration in time range)  ondansetron (ZOFRAN) injection 4 mg (has no administration in time range)  pantoprazole (PROTONIX) injection 40 mg (has no administration in time range)    ED Course/ Medical Decision Making/ A&P                             Medical Decision Making Amount and/or Complexity of Data Reviewed Labs: ordered. Radiology: ordered.  Risk Prescription drug management.   Patient is EKG per interpretation shows normal sinus rhythm.  Patient's COVID and flu test negative here.  Suspect some element of alcoholic gastritis for his symptoms.  Do not think this represents ACS or PE or other serious cardiothoracic problems.  Has 2 negative troponins.  Patient has no evidence of pancreatitis.  Chest x-ray per my interpretation shows no acute process.  Patient feels better and will discharge home.        Final Clinical Impression(s) / ED Diagnoses Final diagnoses:  None    Rx / DC Orders ED Discharge Orders     None         Lacretia Leigh, MD 12/17/22 1018    Lacretia Leigh, MD 12/17/22 1019

## 2023-03-16 IMAGING — CR DG ABDOMEN ACUTE W/ 1V CHEST
3 series · 3 of 3 positions shown · non-contrast
Comparison: 02/01/2022

CLINICAL DATA: Abdominal pain, nausea and vomiting

EXAM:
DG ABDOMEN ACUTE WITH 1 VIEW CHEST

[w chest pa]
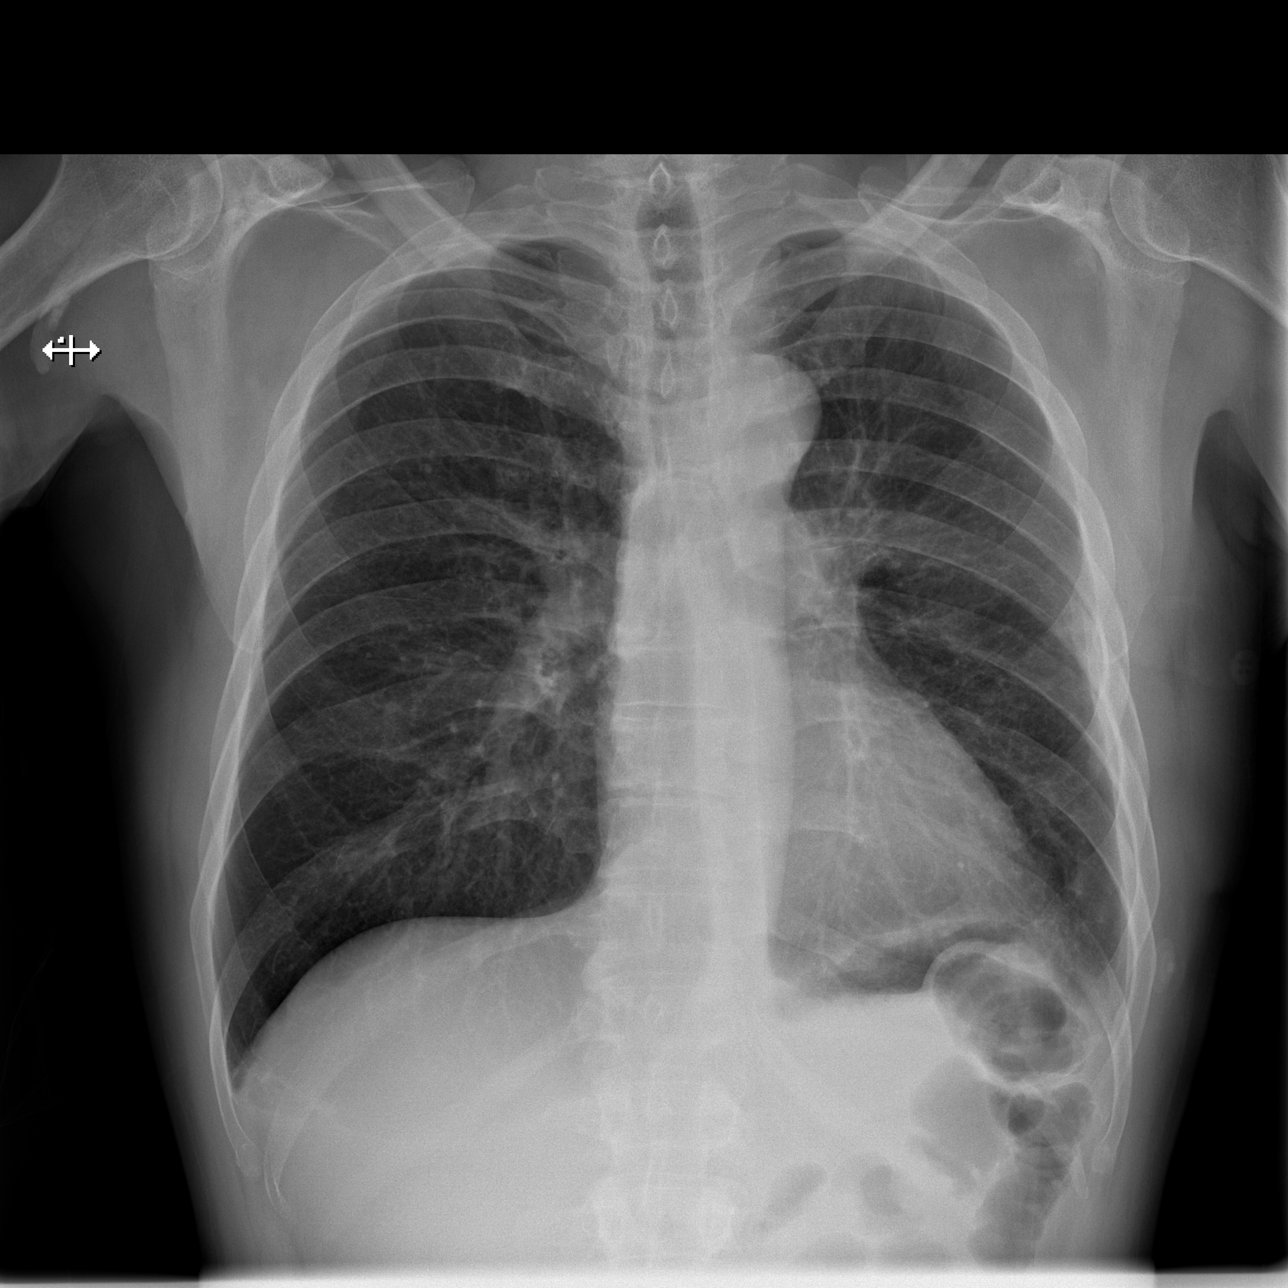

[w abdomen upright]
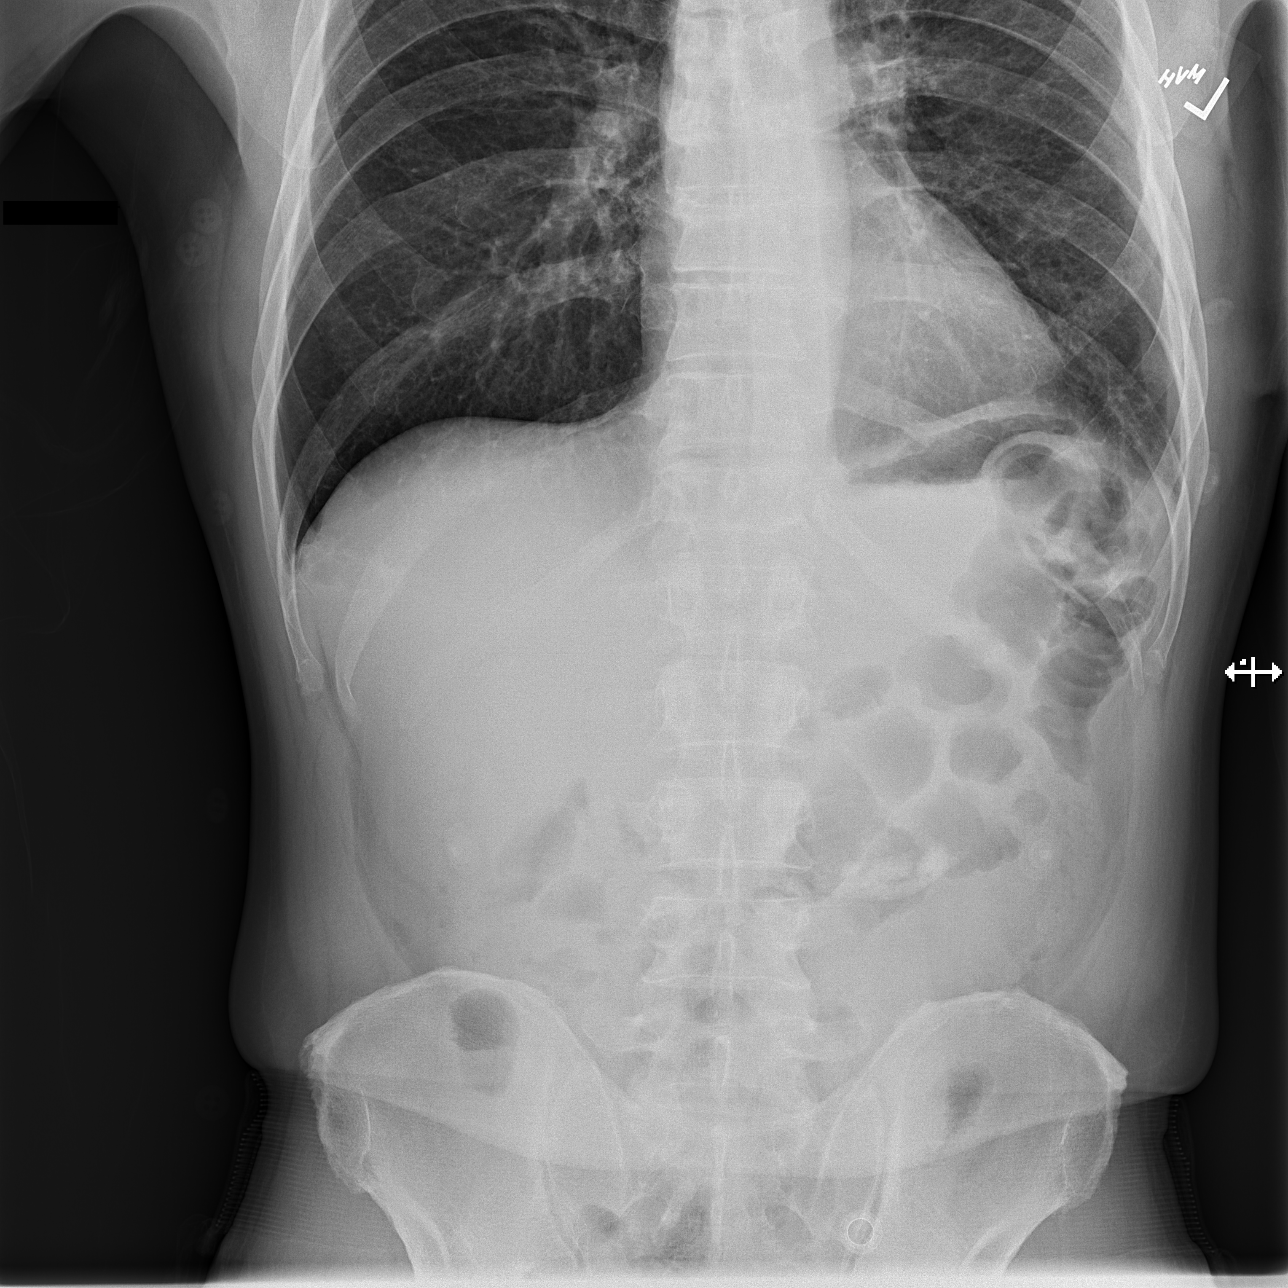

[t abdomen supine]
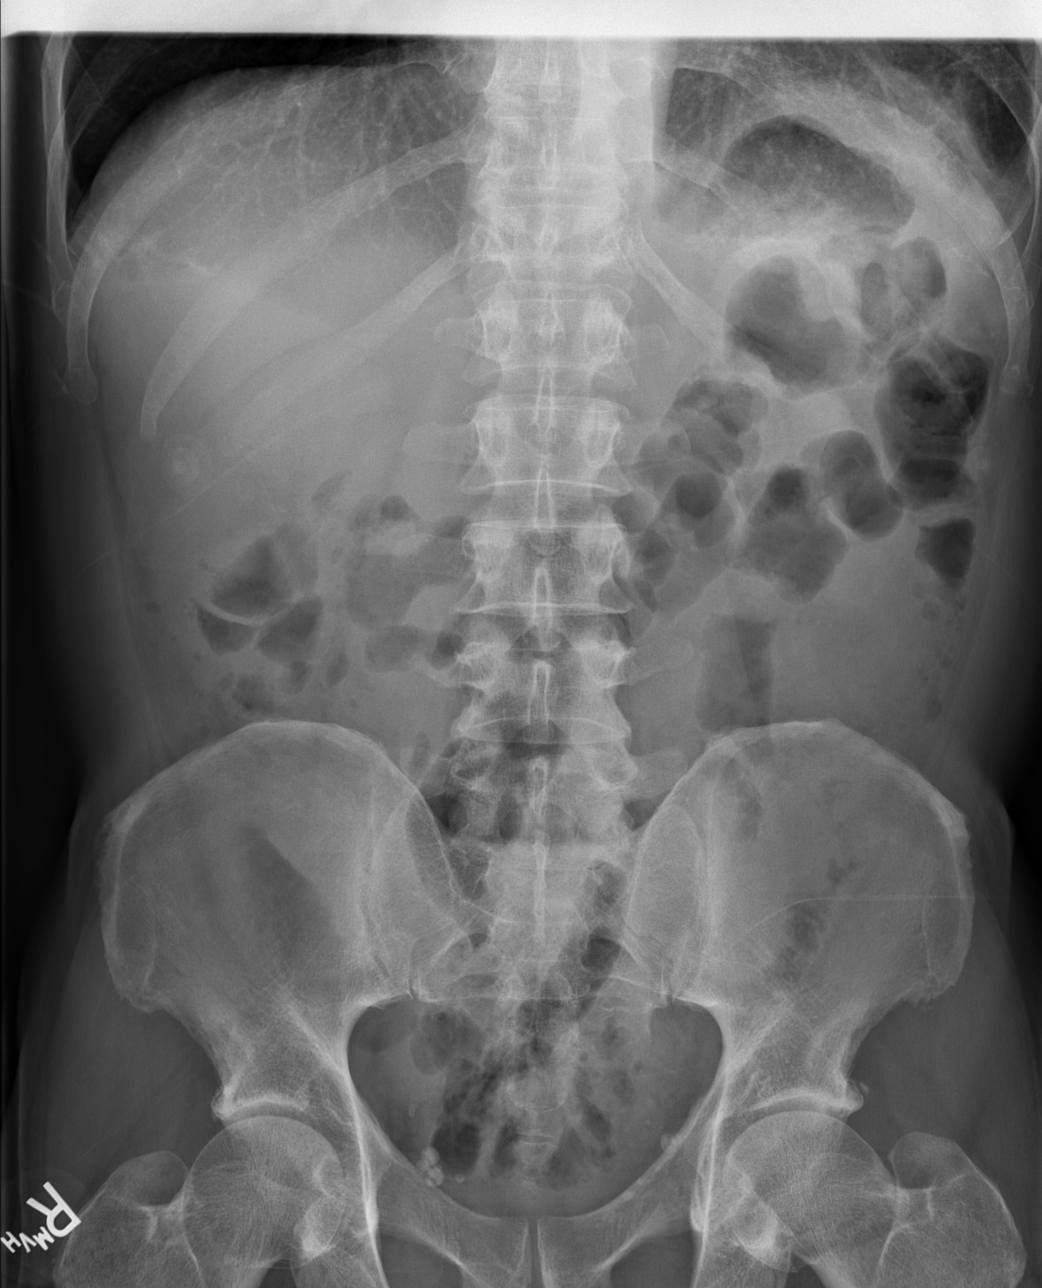

[3 of 3 positions shown; findings below may reference images not displayed]

FINDINGS: Supine and upright frontal views of the abdomen and pelvis as well
as an upright frontal view of the chest are obtained. Cardiac
silhouette is unremarkable. Since the prior exam, there has been
resolution of the multilocular left pleural effusion. Minimal
residual fluid or pleural thickening is seen at the left lateral
costophrenic angle. No airspace disease or pneumothorax.

Bowel gas pattern is unremarkable without obstruction or ileus.
There are no masses or abnormal calcifications. No free gas in the
greater peritoneal sac.
IMPRESSION: 1. Unremarkable bowel gas pattern.
2. Trace residual left pleural fluid versus pleural thickening.
Complete resolution of the multilocular empyema seen previously.

## 2023-03-16 IMAGING — CT CT ABD-PELV W/ CM
2 of 5 series · 16 of 46 positions shown, 18 images · IV contrast (agent unspecified)
Comparison: none

CLINICAL DATA: Epigastric pain

EXAM:
CT ABDOMEN AND PELVIS WITH CONTRAST
TECHNIQUE: Multidetector CT imaging of the abdomen and pelvis was performed
using the standard protocol following bolus administration of
intravenous contrast.

[Series 2: axial st · axial · 0.78mm/px · z∈[+1177,+1547]mm · 13 of 88 slices shown, 15 images]
[im 7/88  soft-tissue]
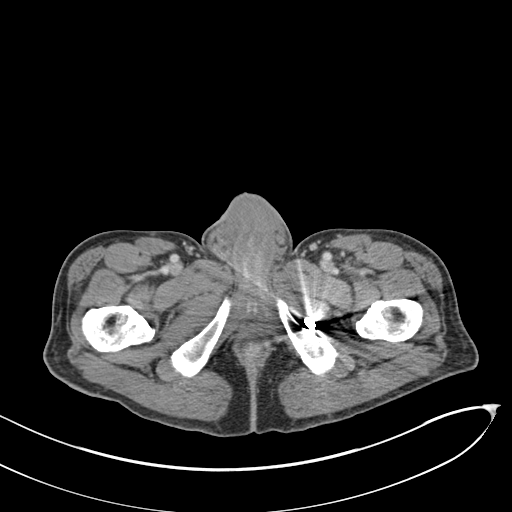
[im 7/88  bone]
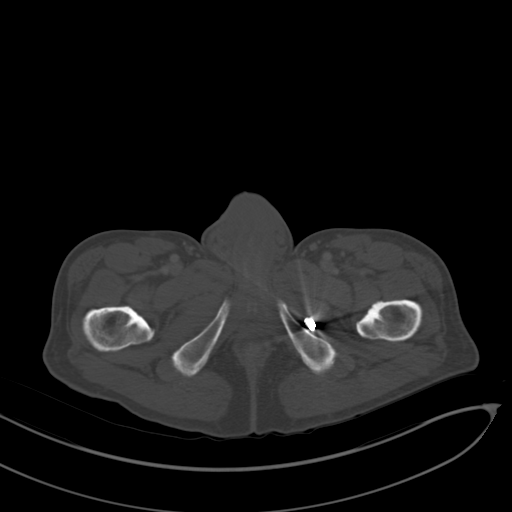
[im 13/88  soft-tissue]
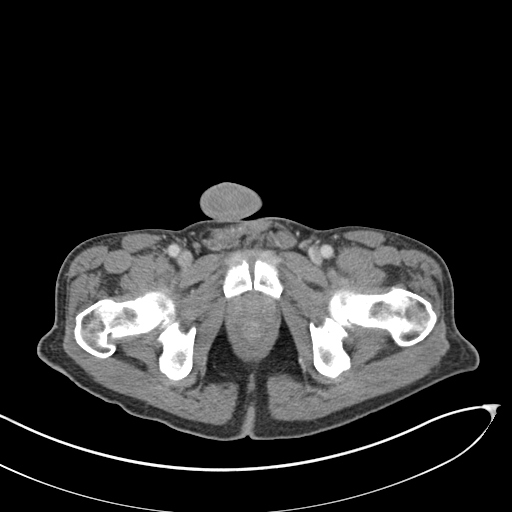
[im 19/88  soft-tissue]
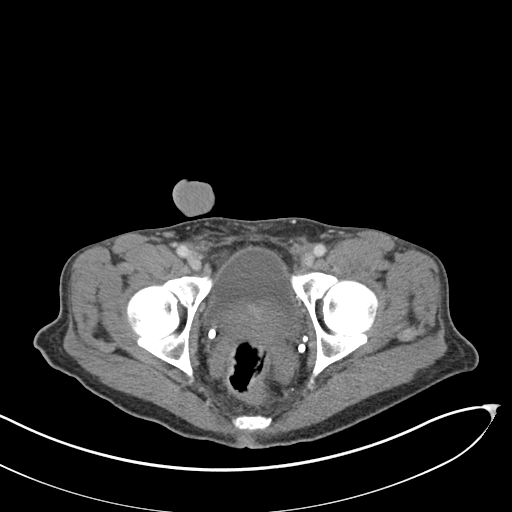
[im 25/88  soft-tissue]
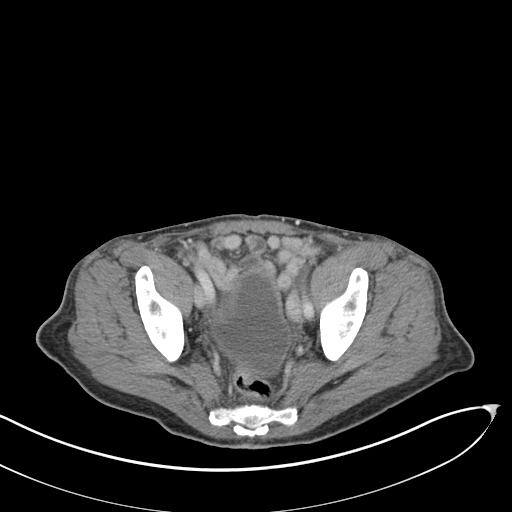
[im 32/88  soft-tissue]
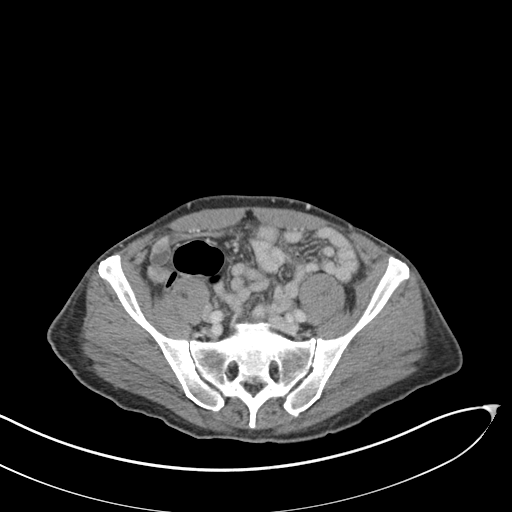
[im 38/88  soft-tissue]
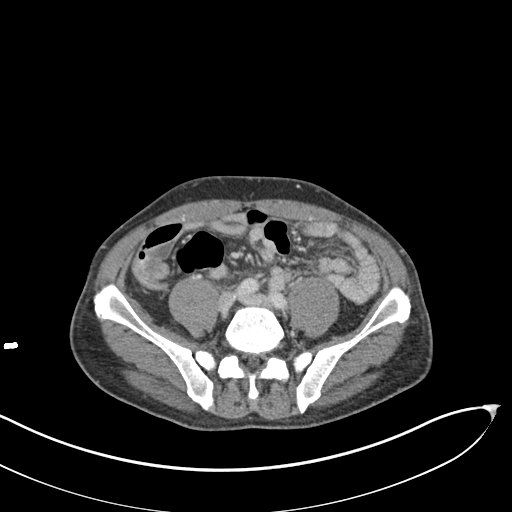
[im 44/88  soft-tissue]
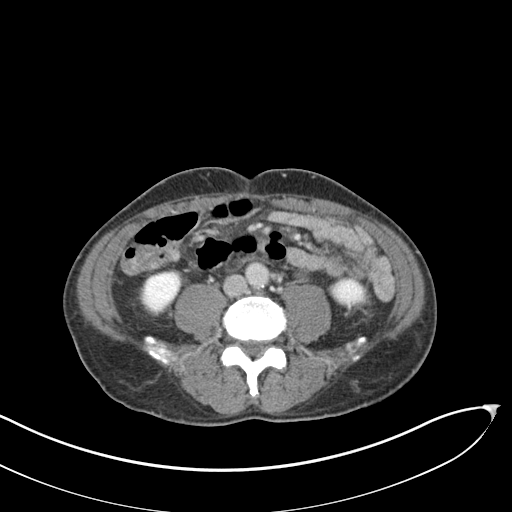
[im 50/88  soft-tissue]
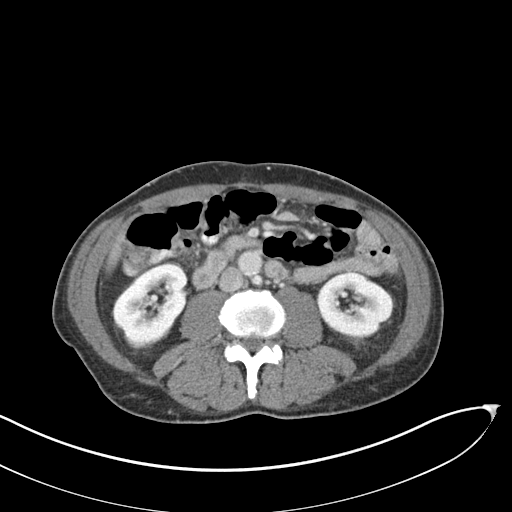
[im 56/88  soft-tissue]
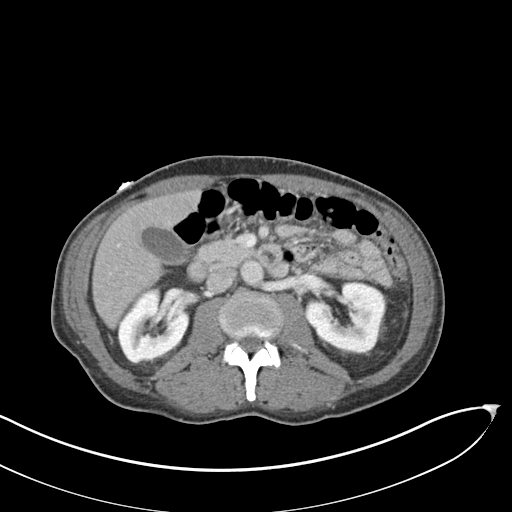
[im 56/88  bone]
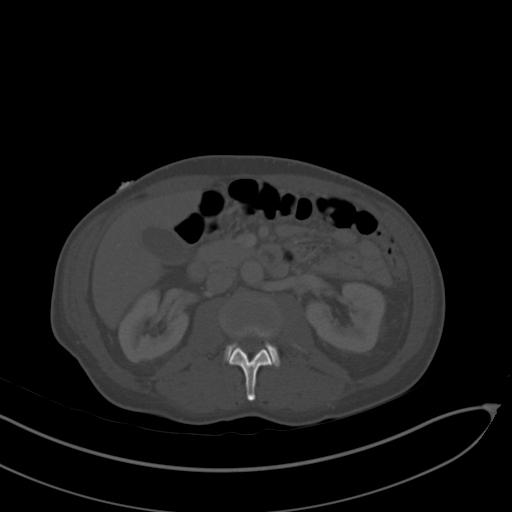
[im 63/88  soft-tissue]
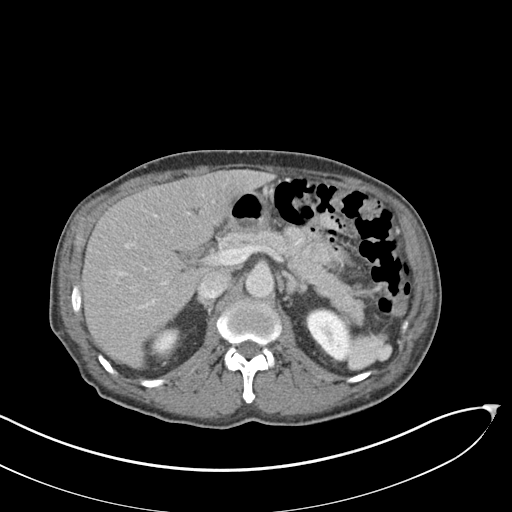
[im 69/88  soft-tissue]
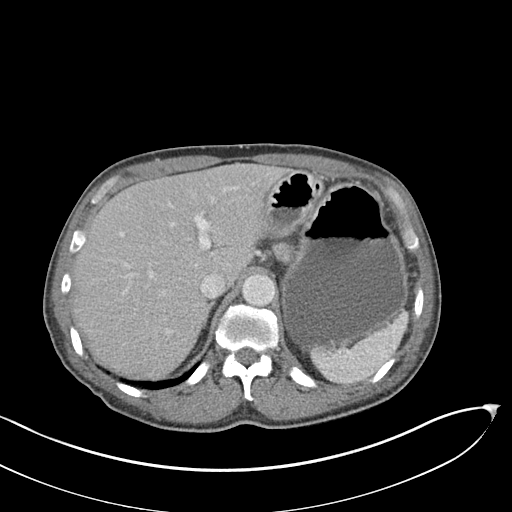
[im 75/88  soft-tissue]
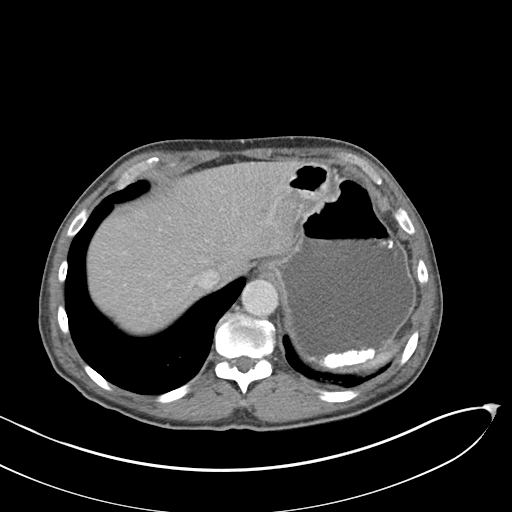
[im 81/88  soft-tissue]
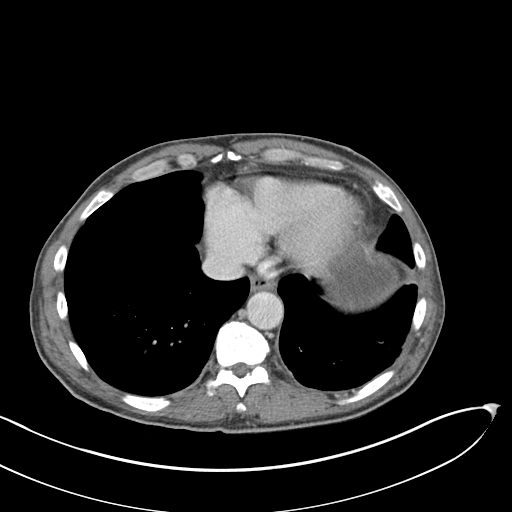

[Series 5: coronal st · coronal · 0.70mm/px · 3 of 125 slices shown]
[im 42/125  soft-tissue]
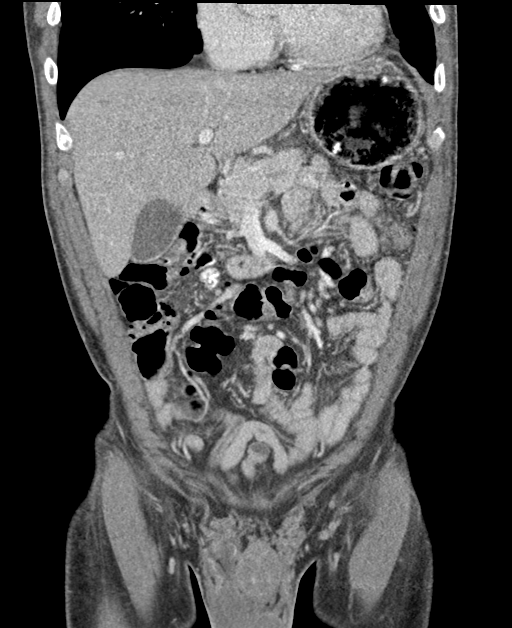
[im 56/125  soft-tissue]
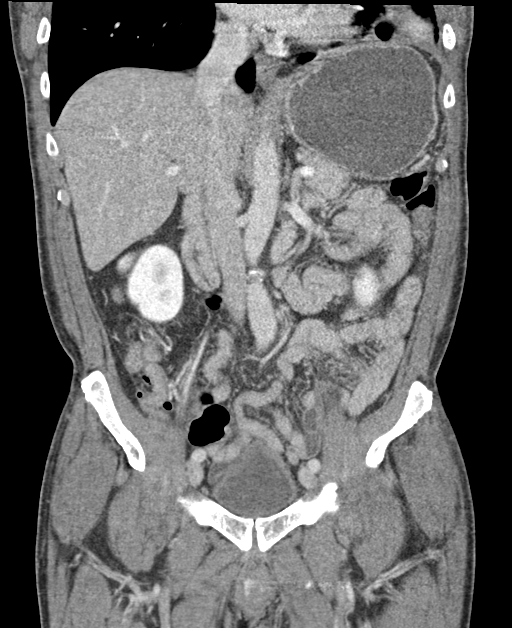
[im 69/125  soft-tissue]
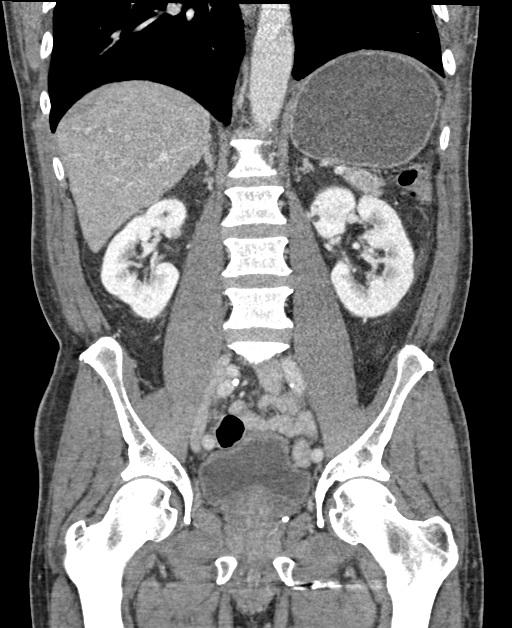

[16 of 46 positions shown; findings below may reference images not displayed]

RADIATION DOSE REDUCTION: This exam was performed according to the
departmental dose-optimization program which includes automated
exposure control, adjustment of the mA and/or kV according to
patient size and/or use of iterative reconstruction technique.

CONTRAST:  100mL OMNIPAQUE IOHEXOL 300 MG/ML  SOLN
FINDINGS: Lower chest: Lung bases demonstrate minimal atelectasis or scar at
the lingula. No acute airspace disease.

Hepatobiliary: Subcentimeter hypodensities in the liver too small to
further characterize. No calcified gallstone or biliary dilatation.

Pancreas: Unremarkable. No pancreatic ductal dilatation or
surrounding inflammatory changes.

Spleen: Normal in size without focal abnormality.

Adrenals/Urinary Tract: Adrenal glands are normal. Subcentimeter
hypodense renal lesions too small to further characterize. No
hydronephrosis. Slightly thick-walled urinary bladder

Stomach/Bowel: Moderate fluid distension of the stomach. Mild area
of luminal narrowing at the gastric antrum with some wall
thickening. No small bowel distension. No colon wall thickening.
Negative appendix.

Vascular/Lymphatic: Moderate aortic atherosclerosis. No aneurysm. No
suspicious lymph nodes

Reproductive: Enlarged prostate

Other: Negative for pelvic effusion or free air.

Musculoskeletal: No acute osseous abnormality
IMPRESSION: 1. Moderate fluid distension of the stomach with an area of gastric
antral narrowing and potential wall thickening, question presence of
an ulcer. Suggest correlation with endoscopy.
2. Slightly thick-walled urinary bladder which could be due to
cystitis or chronic obstruction. There is prostate enlargement

## 2023-08-04 ENCOUNTER — Emergency Department (HOSPITAL_COMMUNITY): Payer: Non-veteran care

## 2023-08-04 ENCOUNTER — Encounter (HOSPITAL_COMMUNITY): Payer: Self-pay

## 2023-08-04 ENCOUNTER — Other Ambulatory Visit: Payer: Self-pay

## 2023-08-04 ENCOUNTER — Emergency Department (HOSPITAL_COMMUNITY)
Admission: EM | Admit: 2023-08-04 | Discharge: 2023-08-05 | Disposition: A | Discharge status: Home / Self Care | Payer: No Typology Code available for payment source | Attending: Emergency Medicine | Admitting: Emergency Medicine

## 2023-08-04 DIAGNOSIS — Z20822 Contact with and (suspected) exposure to covid-19: Secondary | ICD-10-CM | POA: Diagnosis not present

## 2023-08-04 DIAGNOSIS — R109 Unspecified abdominal pain: Secondary | ICD-10-CM | POA: Diagnosis present

## 2023-08-04 DIAGNOSIS — R1084 Generalized abdominal pain: Secondary | ICD-10-CM

## 2023-08-04 DIAGNOSIS — N3000 Acute cystitis without hematuria: Secondary | ICD-10-CM | POA: Diagnosis not present

## 2023-08-04 DIAGNOSIS — R0602 Shortness of breath: Secondary | ICD-10-CM | POA: Diagnosis not present

## 2023-08-04 DIAGNOSIS — R112 Nausea with vomiting, unspecified: Secondary | ICD-10-CM

## 2023-08-04 LAB — TROPONIN I (HIGH SENSITIVITY)
Troponin I (High Sensitivity): 8 ng/L (ref <18)
Troponin I (High Sensitivity): 8 ng/L (ref <18)

## 2023-08-04 LAB — CBC
HCT: 46.1 % (ref 39.0–52.0)
Hemoglobin: 15.2 g/dL (ref 13.0–17.0)
MCH: 29.5 pg (ref 26.0–34.0)
MCHC: 33 g/dL (ref 30.0–36.0)
MCV: 89.3 fL (ref 80.0–100.0)
Platelets: 370 10*3/uL (ref 150–400)
RBC: 5.16 MIL/uL (ref 4.22–5.81)
RDW: 13.4 % (ref 11.5–15.5)
WBC: 7 10*3/uL (ref 4.0–10.5)
nRBC: 0 % (ref 0.0–0.2)

## 2023-08-04 LAB — URINALYSIS, ROUTINE W REFLEX MICROSCOPIC
Bilirubin Urine: NEGATIVE
Glucose, UA: 50 mg/dL — AB
Ketones, ur: 20 mg/dL — AB
Nitrite: POSITIVE — AB
Protein, ur: 30 mg/dL — AB
Specific Gravity, Urine: 1.015 (ref 1.005–1.030)
WBC, UA: >50 WBC/hpf (ref 0–5)
pH: 5 (ref 5.0–8.0)

## 2023-08-04 LAB — COMPREHENSIVE METABOLIC PANEL
ALT: 29 U/L (ref 0–44)
AST: 28 U/L (ref 15–41)
Albumin: 4.4 g/dL (ref 3.5–5.0)
Alkaline Phosphatase: 59 U/L (ref 38–126)
Anion gap: 12 (ref 5–15)
BUN: 16 mg/dL (ref 6–20)
CO2: 23 mmol/L (ref 22–32)
Calcium: 9.3 mg/dL (ref 8.9–10.3)
Chloride: 96 mmol/L — ABNORMAL LOW (ref 98–111)
Creatinine, Ser: 1 mg/dL (ref 0.61–1.24)
GFR, Estimated: >60 mL/min (ref >60)
Glucose, Bld: 196 mg/dL — ABNORMAL HIGH (ref 70–99)
Potassium: 3.6 mmol/L (ref 3.5–5.1)
Sodium: 131 mmol/L — ABNORMAL LOW (ref 135–145)
Total Bilirubin: 1.1 mg/dL (ref 0.3–1.2)
Total Protein: 8.5 g/dL — ABNORMAL HIGH (ref 6.5–8.1)

## 2023-08-04 LAB — LIPASE, BLOOD: Lipase: 29 U/L (ref 11–51)

## 2023-08-04 LAB — SARS CORONAVIRUS 2 BY RT PCR: SARS Coronavirus 2 by RT PCR: NEGATIVE

## 2023-08-04 MED ORDER — MORPHINE SULFATE (PF) 4 MG/ML IV SOLN
4.0000 mg | Freq: Once | INTRAVENOUS | Status: AC
  Administered 2023-08-04: 4 mg via INTRAVENOUS
  Filled 2023-08-04: qty 1

## 2023-08-04 MED ORDER — ONDANSETRON 4 MG PO TBDP: 4.0000 mg | ORAL_TABLET | Freq: Three times a day (TID) | ORAL | 0 refills | Status: AC | PRN

## 2023-08-04 MED ORDER — CEFADROXIL 500 MG PO CAPS: 500.0000 mg | ORAL_CAPSULE | Freq: Two times a day (BID) | ORAL | 0 refills | Status: AC

## 2023-08-04 MED ORDER — OXYCODONE HCL 5 MG PO TABS
5.0000 mg | ORAL_TABLET | Freq: Once | ORAL | Status: AC
  Administered 2023-08-04: 5 mg via ORAL
  Filled 2023-08-04: qty 1

## 2023-08-04 MED ORDER — ONDANSETRON HCL 4 MG/2ML IJ SOLN
4.0000 mg | Freq: Once | INTRAMUSCULAR | Status: AC
  Administered 2023-08-04: 4 mg via INTRAVENOUS
  Filled 2023-08-04: qty 2

## 2023-08-04 MED ORDER — ALBUTEROL SULFATE HFA 108 (90 BASE) MCG/ACT IN AERS: 1.0000 | INHALATION_SPRAY | Freq: Four times a day (QID) | RESPIRATORY_TRACT | 0 refills | Status: AC | PRN

## 2023-08-04 MED ORDER — SODIUM CHLORIDE 0.9 % IV SOLN
2.0000 g | Freq: Once | INTRAVENOUS | Status: AC
  Administered 2023-08-04: 2 g via INTRAVENOUS
  Filled 2023-08-04: qty 20

## 2023-08-04 MED ORDER — SODIUM CHLORIDE 0.9 % IV BOLUS
1000.0000 mL | Freq: Once | INTRAVENOUS | Status: AC
  Administered 2023-08-04: 1000 mL via INTRAVENOUS

## 2023-08-04 NOTE — ED Triage Notes (Addendum)
Per EMS, pt has had trouble breathing for 4x days. Pt also endorses chest pain, N/V/D, abd pain, and fatigue. Pt states the last time this happened he was dx with pneumonia. Given 10 mg albuterol, 1mg  atrovent, and 125mg  of solu-medrol.

## 2023-08-04 NOTE — ED Provider Triage Note (Signed)
Emergency Medicine Provider Triage Evaluation Note  Shawn Steele , a 57 y.o. male  was evaluated in triage.  Pt complains of shortness of breath, chest pain, N/V/D, abdominal pain, and fatigue x 4 days. States last time this happened he was dx with pneumonia. Was given albuterol, atrovent, and solumedrol by EMS. 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   Review of Systems  Positive: As above Negative:   Physical Exam  BP (!) 153/128   Pulse 87   Temp 98.5 F (36.9 C) (Oral)   Resp (!) 23   Ht 5\' 4"  (1.626 m)   Wt 63.5 kg   SpO2 100%   BMI 24.03 kg/m  Gen:   Awake, no distress   Resp:  Normal effort  MSK:   Moves extremities without difficulty  Other:  Pt appears very uncomfortable in exam bed, abdomen soft with generalized tenderness, lung sounds clear  Medical Decision Making  Workup initiated, patient was kept in fast track room to complete breathing treatment but do not have full evaluation room for him right now. Ordered blood work, CXR, and initial dose of pain medication.    Shawn Riebe T, PA-C 08/04/23 1620

## 2023-08-04 NOTE — Discharge Instructions (Signed)
As we discussed, your workup in the ER today revealed that you have a urinary tract infection.  We have given you 1 dose of IV antibiotics for this.  I have also given you a prescription for an antibiotic Duricef for you to fill and take as prescribed in its entirety for management of this.  Additionally, given that you are nauseous and vomiting I have given you a prescription for Zofran which is a nausea medication for you to take as prescribed as needed for any residual nausea and vomiting.  Your labs did show that you are dehydrated and therefore it is very important that you increase your oral hydration over the next few days specifically with fluids high in electrolytes such as Gatorade and Pedialyte.  Additionally, given that you are initially short of breath and your symptoms improved with the breathing treatment that EMS gave you, I have given you a prescription for an albuterol inhaler for you to use as prescribed as needed for any wheezing or shortness of breath you may experience.  I recommend that you follow-up closely with your primary doctor as you need formal lung testing to determine the etiology behind this.  Please call to schedule an appointment for follow-up at your earliest convenience.  Return if development of any new or worsening symptoms.

## 2023-08-04 NOTE — ED Provider Notes (Signed)
[place] EMERGENCY DEPARTMENT AT [facility] Provider Note   CSN: [id number] Arrival date & time: 08/04/23  1442     History  Chief Complaint  Patient presents with   Shortness of Breath        Abdominal Pain    Shawn Steele is a 57 y.o. male.  Patient with history of alcohol gastritis presents today with complaints of cough, congestion, shortness of breath, nausea, vomiting, diarrhea, and abdominal pain. He states that same began 4 days ago and has been persistent since. He states that he feels like when he had pneumonia previously. He was reportedly wheezing with EMS and was subsequently given a duoneb treatment and solu-medrol. He states that his shortness of breath resolved with these interventions. His abdominal pain is generalized, denies any hematemesis, hematochezia, or melena. He states that he no longer drinks alcohol. Does note that he smokes 1.5 packs/day for many years. Denies leg pain, leg swelling, recent travel or recent surgeries. Does note that he has some dysuria. Denies fevers or chills. No known sick contacts. He denies any penile discharge or concern for STIs.  The history is provided by the patient. No language interpreter was used.  Shortness of Breath Associated symptoms: abdominal pain and vomiting   Abdominal Pain Associated symptoms: diarrhea, nausea, shortness of breath (resolved) and vomiting        Home Medications Prior to Admission medications   Medication Sig Start Date End Date Taking? Authorizing Provider  ondansetron (ZOFRAN) 4 MG tablet Take 1 tablet (4 mg total) by mouth every 6 (six) hours. 03/29/22   Gwyneth Sprout, MD  sucralfate (CARAFATE) 1 g tablet Take 1 tablet (1 g total) by mouth 4 (four) times daily. 12/17/22   Lorre Nick, MD      Allergies    Patient has no known allergies.    Review of Systems   Review of Systems  Respiratory:  Positive for shortness of breath (resolved).   Gastrointestinal:  Positive  for abdominal pain, diarrhea, nausea and vomiting.  All other systems reviewed and are negative.   Physical Exam Updated Vital Signs BP (!) 142/105 (BP Location: Left Arm)   Pulse (!) 111   Temp 98.7 F (37.1 C) (Oral)   Resp 18   Ht 5\' 4"  (1.626 m)   Wt 63.5 kg   SpO2 99%   BMI 24.03 kg/m  Physical Exam Vitals and nursing note reviewed.  Constitutional:      General: He is not in acute distress.    Appearance: Normal appearance. He is normal weight. He is not ill-appearing, toxic-appearing or diaphoretic.  HENT:     Head: Normocephalic and atraumatic.  Cardiovascular:     Rate and Rhythm: Normal rate and regular rhythm.     Pulses:          Dorsalis pedis pulses are 2+ on the right side and 2+ on the left side.       Posterior tibial pulses are 2+ on the right side and 2+ on the left side.     Heart sounds: Normal heart sounds.  Pulmonary:     Effort: Pulmonary effort is normal. No respiratory distress.     Breath sounds: Normal breath sounds.  Chest:     Chest wall: No tenderness.  Abdominal:     General: Abdomen is flat.     Palpations: Abdomen is soft.     Tenderness: There is generalized abdominal tenderness. There is no guarding or rebound.  Musculoskeletal:        General: Normal range of motion.     Cervical back: Normal range of motion.     Right lower leg: No tenderness. No edema.     Left lower leg: No tenderness. No edema.  Skin:    General: Skin is warm and dry.  Neurological:     General: No focal deficit present.     Mental Status: He is alert.  Psychiatric:        Mood and Affect: Mood normal.        Behavior: Behavior normal.     ED Results / Procedures / Treatments   Labs (all labs ordered are listed, but only abnormal results are displayed) Labs Reviewed  COMPREHENSIVE METABOLIC PANEL - Abnormal; Notable for the following components:      Result Value   Sodium 131 (*)    Chloride 96 (*)    Glucose, Bld 196 (*)    Total Protein 8.5 (*)     All other components within normal limits  URINALYSIS, ROUTINE W REFLEX MICROSCOPIC - Abnormal; Notable for the following components:   APPearance HAZY (*)    Glucose, UA 50 (*)    Hgb urine dipstick SMALL (*)    Ketones, ur 20 (*)    Protein, ur 30 (*)    Nitrite POSITIVE (*)    Leukocytes,Ua LARGE (*)    Bacteria, UA MANY (*)    All other components within normal limits  SARS CORONAVIRUS 2 BY RT PCR  URINE CULTURE  LIPASE, BLOOD  CBC  TROPONIN I (HIGH SENSITIVITY)  TROPONIN I (HIGH SENSITIVITY)    EKG EKG Interpretation Date/Time:  Monday August 04 2023 14:59:51 EDT Ventricular Rate:  79 PR Interval:  168 QRS Duration:  81 QT Interval:  540 QTC Calculation: 620 R Axis:   92  Text Interpretation: Sinus rhythm Probable left atrial enlargement Borderline right axis deviation Probable anteroseptal infarct, old No significant change since last tracing Confirmed by Jacalyn Lefevre [phone]) on 08/04/2023 8:44:50 PM  Radiology DG Chest Port 1 View  Result Date: 08/04/2023 CLINICAL DATA:  Shortness of breath. EXAM: PORTABLE CHEST 1 VIEW COMPARISON:  12/17/2022. FINDINGS: Bilateral lung fields are clear. Bilateral costophrenic angles are clear. Normal cardio-mediastinal silhouette. No acute osseous abnormalities. The soft tissues are within normal limits. IMPRESSION: No active disease. Electronically Signed   By: Jules Schick M.D.   On: 08/04/2023 16:24    Procedures Procedures    Medications Ordered in ED Medications  oxyCODONE (Oxy IR/ROXICODONE) immediate release tablet 5 mg (5 mg Oral Given 08/04/23 1617)  sodium chloride 0.9 % bolus 1,000 mL (1,000 mLs Intravenous New Bag/Given 08/04/23 2115)  ondansetron (ZOFRAN) injection 4 mg (4 mg Intravenous Given 08/04/23 2116)  morphine (PF) 4 MG/ML injection 4 mg (4 mg Intravenous Given 08/04/23 2116)  cefTRIAXone (ROCEPHIN) 2 g in sodium chloride 0.9 % 100 mL IVPB (2 g Intravenous New Bag/Given 08/04/23 2216)    ED Course/  Medical Decision Making/ A&P                                 Medical Decision Making Amount and/or Complexity of Data Reviewed Labs: ordered.  Risk Prescription drug management.   This patient is a 57 y.o. male who presents to the ED for concern of shortness of breath, abdominal pain, nausea, vomiting, diarrhea, this involves an extensive number of treatment options, and is a  complaint that carries with it a high risk of complications and morbidity. The emergent differential diagnosis prior to evaluation includes, but is not limited to, arrhythmias, ACS, COPD, asthma, bronchitis, pneumonia, pneumothorax, PE, anemia  The differential diagnosis for generalized abdominal pain includes, but is not limited to AAA, gastroenteritis, appendicitis, Bowel obstruction, Bowel perforation. Gastroparesis, DKA, Hernia, Inflammatory bowel disease, mesenteric ischemia, pancreatitis, peritonitis SBP, volvulus.   This is not an exhaustive differential.   Past Medical History / Co-morbidities / Social History: Hx alcohol and tobacco use  Additional history: Chart reviewed. Pertinent results include: recent visits for similar symptoms, diagnosed with alcohol gastritis  Physical Exam: Physical exam performed. The pertinent findings include: Lung sounds clear to auscultation in all fields.  Patient speaking in complete sentences.  Generalized abdominal tenderness without rebound or guarding.  Lab Tests: I ordered, and personally interpreted labs.  The pertinent results include:  Na 131, chloride 96, glucose 196. Delta troponin negative. COVID negative. UA infectious with WBCs, nitrites, bacteria   Imaging Studies: I ordered imaging studies including CXR. I independently visualized and interpreted imaging which showed NAD. I agree with the radiologist interpretation.   Cardiac Monitoring:  The patient was maintained on a cardiac monitor.  My attending physician Dr. Particia Nearing viewed and interpreted the  cardiac monitored which showed an underlying rhythm of: sinus rhythm, no STEMI. I agree with this interpretation.   Medications: I ordered medication including morphine for pain, zofran for nausea, rocephin for UTI, fluids for dehydration. Reevaluation of the patient after these medicines showed that the patient resolved. I have reviewed the patients home medicines and have made adjustments as needed.  Disposition: After consideration of the diagnostic results and the patients response to treatment, I feel that emergency department workup does not suggest an emergent condition requiring admission or immediate intervention beyond what has been performed at this time. The plan is: discharge with duricef for UTI, zofran for nausea/vomiting, and albuterol inhaler for wheezing present with EMS. Patient reportedly smokes 1.5 packs daily for years, could have undiagnosed COPD as he has not seen a PCP in some time. Given the duoneb resolved his shortness of breath, will give inhaler for residual symptoms. I have recommended he see his pcp for formal PFTs as well. He is understanding and in agreement with this. He notes that he receives his care through the [place].   Patients symptoms likely viral vs pain from his UTI. He denies any concern for STI, therefore will treat with Duricef.   Additionally, after zofran, patient is able to eat and drink without any episodes of nausea or vomiting. Will send for zofran as well. His pain is also controlled. Shared decision making implemented, I did offer further evaluation with CT imaging for PE as well as acute intra-abdominal etiology of symptoms, however patient states that all of his symptoms have resolved and he would prefer to be discharged home and follow-up outpatient with close return precautions which is reasonable given his benign laboratory evaluation and reassuring exam and vital signs. Evaluation and diagnostic testing in the emergency department does not suggest an  emergent condition requiring admission or immediate intervention beyond what has been performed at this time.  Plan for discharge with close PCP follow-up.  Patient is understanding and amenable with plan, educated on red flag symptoms that would prompt immediate return.  Patient discharged in stable condition.  Final Clinical Impression(s) / ED Diagnoses Final diagnoses:  Shortness of breath  Nausea vomiting and diarrhea  Generalized abdominal pain  Acute cystitis without hematuria    Rx / DC Orders ED Discharge Orders          Ordered    cefadroxil (DURICEF) 500 MG capsule  2 times daily        08/04/23 2356    ondansetron (ZOFRAN-ODT) 4 MG disintegrating tablet  Every 8 hours PRN        08/04/23 2356    albuterol (VENTOLIN HFA) 108 (90 Base) MCG/ACT inhaler  Every 6 hours PRN        08/04/23 2356          An After Visit Summary was printed and given to the patient.     Vear Clock 08/04/23 Caroline Sauger, MD 08/04/23 [phone]

## 2023-08-07 LAB — URINE CULTURE: Culture: >=100000 — AB

## 2023-08-12 ENCOUNTER — Telehealth (HOSPITAL_BASED_OUTPATIENT_CLINIC_OR_DEPARTMENT_OTHER): Payer: Self-pay | Admitting: *Deleted

## 2023-08-12 NOTE — Telephone Encounter (Signed)
Post ED Visit - Positive Culture Follow-up  Culture report reviewed by antimicrobial stewardship pharmacist: [facility] []  Enzo Bi, Pharm.D. []  Celedonio Miyamoto, Pharm.D., BCPS AQ-ID []  Garvin Fila, Pharm.D., BCPS [x]  Georgina Pillion, [address].D., BCPS []  [place], [address].D., BCPS, AAHIVP []  Estella Husk, Pharm.D., BCPS, AAHIVP []  Lysle Pearl, PharmD, BCPS []  Phillips Climes, PharmD, BCPS []  Agapito Games, PharmD, BCPS []  Verlan Friends, PharmD []  Mervyn Gay, PharmD, BCPS []  Vinnie Level, PharmD  [facility] []  Len Childs, PharmD []  Greer Pickerel, PharmD []  Adalberto Cole, PharmD []  Perlie Gold, Rph []  Lonell Face) Jean Rosenthal, PharmD []  Earl Many, PharmD []  Junita Push, PharmD []  Dorna Leitz, PharmD []  Terrilee Files, PharmD []  Lynann Beaver, PharmD []  Keturah Barre, PharmD []  Loralee Pacas, PharmD []  Bernadene Person, PharmD   Positive urine culture Treated with Cefadroxil, organism sensitive to the same and no further patient follow-up is required at this time.  Virl Axe [facility] 08/12/2023, 11:47 AM

## 2024-03-25 ENCOUNTER — Emergency Department (HOSPITAL_COMMUNITY): Payer: Self-pay

## 2024-03-25 ENCOUNTER — Encounter (HOSPITAL_COMMUNITY): Payer: Self-pay

## 2024-03-25 ENCOUNTER — Other Ambulatory Visit: Payer: Self-pay

## 2024-03-25 ENCOUNTER — Emergency Department (HOSPITAL_COMMUNITY)
Admission: EM | Admit: 2024-03-25 | Discharge: 2024-03-25 | Disposition: A | Discharge status: Home / Self Care | Attending: Emergency Medicine | Admitting: Emergency Medicine

## 2024-03-25 DIAGNOSIS — N491 Inflammatory disorders of spermatic cord, tunica vaginalis and vas deferens: Secondary | ICD-10-CM | POA: Insufficient documentation

## 2024-03-25 DIAGNOSIS — N342 Other urethritis: Secondary | ICD-10-CM | POA: Insufficient documentation

## 2024-03-25 DIAGNOSIS — R1909 Other intra-abdominal and pelvic swelling, mass and lump: Secondary | ICD-10-CM

## 2024-03-25 LAB — COMPREHENSIVE METABOLIC PANEL WITH GFR
ALT: 14 U/L (ref 0–44)
AST: 16 U/L (ref 15–41)
Albumin: 3.7 g/dL (ref 3.5–5.0)
Alkaline Phosphatase: 56 U/L (ref 38–126)
Anion gap: 10 (ref 5–15)
BUN: 9 mg/dL (ref 6–20)
CO2: 24 mmol/L (ref 22–32)
Calcium: 9 mg/dL (ref 8.9–10.3)
Chloride: 100 mmol/L (ref 98–111)
Creatinine, Ser: 0.8 mg/dL (ref 0.61–1.24)
GFR, Estimated: >60 mL/min (ref >60)
Glucose, Bld: 130 mg/dL — ABNORMAL HIGH (ref 70–99)
Potassium: 3.3 mmol/L — ABNORMAL LOW (ref 3.5–5.1)
Sodium: 134 mmol/L — ABNORMAL LOW (ref 135–145)
Total Bilirubin: 0.8 mg/dL (ref 0.0–1.2)
Total Protein: 7.6 g/dL (ref 6.5–8.1)

## 2024-03-25 LAB — URINALYSIS, ROUTINE W REFLEX MICROSCOPIC
Bacteria, UA: NONE SEEN
Bilirubin Urine: NEGATIVE
Glucose, UA: NEGATIVE mg/dL
Ketones, ur: NEGATIVE mg/dL
Nitrite: NEGATIVE
Protein, ur: 100 mg/dL — AB
RBC / HPF: >50 RBC/hpf (ref 0–5)
Specific Gravity, Urine: 1.024 (ref 1.005–1.030)
WBC, UA: >50 WBC/hpf (ref 0–5)
pH: 6 (ref 5.0–8.0)

## 2024-03-25 LAB — CBC WITH DIFFERENTIAL/PLATELET
Abs Immature Granulocytes: 0.03 10*3/uL (ref 0.00–0.07)
Basophils Absolute: 0 10*3/uL (ref 0.0–0.1)
Basophils Relative: 0 %
Eosinophils Absolute: 0 10*3/uL (ref 0.0–0.5)
Eosinophils Relative: 0 %
HCT: 41 % (ref 39.0–52.0)
Hemoglobin: 13.5 g/dL (ref 13.0–17.0)
Immature Granulocytes: 0 %
Lymphocytes Relative: 22 %
Lymphs Abs: 2.7 10*3/uL (ref 0.7–4.0)
MCH: 29.4 pg (ref 26.0–34.0)
MCHC: 32.9 g/dL (ref 30.0–36.0)
MCV: 89.3 fL (ref 80.0–100.0)
Monocytes Absolute: 0.8 10*3/uL (ref 0.1–1.0)
Monocytes Relative: 6 %
Neutro Abs: 8.7 10*3/uL — ABNORMAL HIGH (ref 1.7–7.7)
Neutrophils Relative %: 72 %
Platelets: 305 10*3/uL (ref 150–400)
RBC: 4.59 MIL/uL (ref 4.22–5.81)
RDW: 13.2 % (ref 11.5–15.5)
WBC: 12.3 10*3/uL — ABNORMAL HIGH (ref 4.0–10.5)
nRBC: 0 % (ref 0.0–0.2)

## 2024-03-25 LAB — LIPASE, BLOOD: Lipase: 32 U/L (ref 11–51)

## 2024-03-25 MED ORDER — LIDOCAINE HCL (PF) 1 % IJ SOLN
2.0000 mL | Freq: Once | INTRAMUSCULAR | Status: AC
  Administered 2024-03-25: 2 mL
  Filled 2024-03-25: qty 30

## 2024-03-25 MED ORDER — IOHEXOL 300 MG/ML  SOLN
100.0000 mL | Freq: Once | INTRAMUSCULAR | Status: AC | PRN
  Administered 2024-03-25: 100 mL via INTRAVENOUS

## 2024-03-25 MED ORDER — LACTATED RINGERS IV BOLUS
1000.0000 mL | Freq: Once | INTRAVENOUS | Status: AC
  Administered 2024-03-25: 1000 mL via INTRAVENOUS

## 2024-03-25 MED ORDER — HYDROMORPHONE HCL 1 MG/ML IJ SOLN
0.5000 mg | Freq: Once | INTRAMUSCULAR | Status: AC
  Administered 2024-03-25: 0.5 mg via INTRAVENOUS
  Filled 2024-03-25: qty 1

## 2024-03-25 MED ORDER — CEFTRIAXONE SODIUM 1 G IJ SOLR
500.0000 mg | Freq: Once | INTRAMUSCULAR | Status: AC
  Administered 2024-03-25: 500 mg via INTRAMUSCULAR
  Filled 2024-03-25: qty 10

## 2024-03-25 MED ORDER — ONDANSETRON HCL 4 MG/2ML IJ SOLN
4.0000 mg | Freq: Once | INTRAMUSCULAR | Status: AC
  Administered 2024-03-25: 4 mg via INTRAVENOUS
  Filled 2024-03-25: qty 2

## 2024-03-25 MED ORDER — DOXYCYCLINE HYCLATE 100 MG PO CAPS
100.0000 mg | ORAL_CAPSULE | Freq: Two times a day (BID) | ORAL | 0 refills | Status: AC
Start: 2024-03-25 — End: ?

## 2024-03-25 MED ORDER — DOXYCYCLINE HYCLATE 100 MG PO TABS
100.0000 mg | ORAL_TABLET | Freq: Once | ORAL | Status: AC
  Administered 2024-03-25: 100 mg via ORAL
  Filled 2024-03-25: qty 1

## 2024-03-25 NOTE — ED Triage Notes (Signed)
 Pt reports concern for left side groin swelling he noticed yesterday. Sts he a similar s/s requiring hernia surgical repair to the right side. Pain is associated delay in initiating stream and white penial discharge.

## 2024-03-25 NOTE — ED Provider Notes (Signed)
 [facility] Emergency Department Provider Note MRN:  [id number]  Arrival date & time: 03/25/24     Chief Complaint   Groin Swelling   History of Present Illness   Shawn Steele is a 58 y.o. year-old male presents to the ED with chief complaint of left groin swelling.  Onset was yesterday.  States it feels similar to when he had prior hernia.  He states that he is having some dysuria and discharge from the penis.  He denies fevers or chills.  He reports pain into the testicles.  He denies any treatments prior to arrival.  History provided by patient.   Review of Systems  Pertinent positive and negative review of systems noted in HPI.    Physical Exam   Vitals:   03/25/24 0300 03/25/24 0430  BP: (!) 165/95 (!) 155/98  Pulse: 83 67  Resp: 20 17  Temp: 98.9 F (37.2 C)   SpO2: 95% 96%    CONSTITUTIONAL:  non toxic-appearing, NAD NEURO:  Alert and oriented x 3, CN 3-12 grossly intact EYES:  eyes equal and reactive ENT/NECK:  Supple, no stridor  CARDIO:  normal rate, regular rhythm, appears well-perfused  PULM:  No respiratory distress, CTAB GI/GU:  non-distended, left inguinal swelling, left testicle swelling MSK/SPINE:  No gross deformities, no edema, moves all extremities  SKIN:  no rash, atraumatic   *Additional and/or pertinent findings included in MDM below  Diagnostic and Interventional Summary    EKG Interpretation Date/Time:    Ventricular Rate:    PR Interval:    QRS Duration:    QT Interval:    QTC Calculation:   R Axis:      Text Interpretation:         Labs Reviewed  URINALYSIS, ROUTINE W REFLEX MICROSCOPIC - Abnormal; Notable for the following components:      Result Value   APPearance CLOUDY (*)    Hgb urine dipstick SMALL (*)    Protein, ur 100 (*)    Leukocytes,Ua LARGE (*)    All other components within normal limits  COMPREHENSIVE METABOLIC PANEL WITH GFR - Abnormal; Notable for the following components:    Sodium 134 (*)    Potassium 3.3 (*)    Glucose, Bld 130 (*)    All other components within normal limits  CBC WITH DIFFERENTIAL/PLATELET - Abnormal; Notable for the following components:   WBC 12.3 (*)    Neutro Abs 8.7 (*)    All other components within normal limits  LIPASE, BLOOD    CT ABDOMEN PELVIS W CONTRAST  Final Result      Medications  cefTRIAXone  (ROCEPHIN ) injection 500 mg (has no administration in time range)  doxycycline (VIBRA-TABS) tablet 100 mg (has no administration in time range)  HYDROmorphone  (DILAUDID ) injection 0.5 mg (0.5 mg Intravenous Given 03/25/24 0254)  ondansetron  (ZOFRAN ) injection 4 mg (4 mg Intravenous Given 03/25/24 0255)  lactated ringers  bolus 1,000 mL (0 mLs Intravenous Stopped 03/25/24 0436)  iohexol  (OMNIPAQUE ) 300 MG/ML solution 100 mL (100 mLs Intravenous Contrast Given 03/25/24 0344)     Procedures  /  Critical Care Procedures  ED Course and Medical Decision Making  I have reviewed the triage vital signs, the nursing notes, and pertinent available records from the EMR.  Social Determinants Affecting Complexity of Care: Patient has no clinically significant social determinants affecting this chief complaint..   ED Course:    Medical Decision Making Patient here with groin swelling and penile discharge.  Onset  was yesterday.  He is concerned about inguinal hernia, which he has had before.  He denies fevers or chills.  Denies any nausea or vomiting.  Urinalysis is worrisome for infection.  CT notable for questionable vasitis.  Will treat for STI.  Recommend that he follow-up with urology.  Amount and/or Complexity of Data Reviewed Labs: ordered. Radiology: ordered.  Risk Prescription drug management.         Consultants: No consultations were needed in caring for this patient.   Treatment and Plan: I considered admission due to patient's initial presentation, but after considering the examination and diagnostic results,  patient will not require admission and can be discharged with outpatient follow-up.  Patient discussed with attending physician, Dr. Palumbo, who recommends treatment for STI and urology follow-up.  Final Clinical Impressions(s) / ED Diagnoses     ICD-10-CM   1. Urethritis  N34.2     2. Vasitis  N49.1     3. Groin swelling  R19.09       ED Discharge Orders          Ordered    doxycycline (VIBRAMYCIN) 100 MG capsule  2 times daily        03/25/24 0454              Discharge Instructions Discussed with and Provided to Patient:     Discharge Instructions      Please make an appointment and follow-up with the urologist.  Please take antibiotics as directed.  Please return for new or worsening symptoms.       Sherel Dikes, PA-C 03/25/24 3244    Maralee Senate, April, MD 03/25/24 [phone]

## 2024-03-25 NOTE — Discharge Instructions (Addendum)
 Please make an appointment and follow-up with the urologist.  Please take antibiotics as directed.  Please return for new or worsening symptoms.
